# Patient Record
Sex: Female | Born: 1975 | Race: White | Hispanic: No | Marital: Married | Smoking: Never smoker
Health system: Southern US, Community
[De-identification: ages and names within clinical notes are randomized; demographics above are authoritative.]

## PROBLEM LIST (undated history)

## (undated) DIAGNOSIS — K5790 Diverticulosis of intestine, part unspecified, without perforation or abscess without bleeding: Secondary | ICD-10-CM

## (undated) DIAGNOSIS — E7800 Pure hypercholesterolemia, unspecified: Secondary | ICD-10-CM

## (undated) HISTORY — PX: TONSILLECTOMY: SUR1361

## (undated) HISTORY — DX: Pure hypercholesterolemia, unspecified: E78.00

## (undated) HISTORY — DX: Diverticulosis of intestine, part unspecified, without perforation or abscess without bleeding: K57.90

---

## 2006-10-29 ENCOUNTER — Ambulatory Visit (HOSPITAL_COMMUNITY): Admission: RE | Admit: 2006-10-29 | Discharge: 2006-10-29 | Payer: Self-pay

## 2006-11-18 ENCOUNTER — Ambulatory Visit (HOSPITAL_COMMUNITY): Admission: RE | Admit: 2006-11-18 | Discharge: 2006-11-18 | Payer: Self-pay | Admitting: Obstetrics & Gynecology

## 2006-12-08 ENCOUNTER — Ambulatory Visit (HOSPITAL_COMMUNITY): Admission: RE | Admit: 2006-12-08 | Discharge: 2006-12-08 | Payer: Self-pay | Admitting: Obstetrics & Gynecology

## 2006-12-28 ENCOUNTER — Ambulatory Visit (HOSPITAL_COMMUNITY): Admission: RE | Admit: 2006-12-28 | Discharge: 2006-12-28 | Payer: Self-pay | Admitting: Obstetrics and Gynecology

## 2007-01-19 ENCOUNTER — Ambulatory Visit (HOSPITAL_COMMUNITY): Admission: RE | Admit: 2007-01-19 | Discharge: 2007-01-19 | Payer: Self-pay | Admitting: Obstetrics & Gynecology

## 2007-02-08 ENCOUNTER — Ambulatory Visit (HOSPITAL_COMMUNITY): Admission: RE | Admit: 2007-02-08 | Discharge: 2007-02-08 | Payer: Self-pay | Admitting: Obstetrics & Gynecology

## 2007-03-01 ENCOUNTER — Ambulatory Visit (HOSPITAL_COMMUNITY): Admission: RE | Admit: 2007-03-01 | Discharge: 2007-03-01 | Payer: Self-pay | Admitting: Obstetrics & Gynecology

## 2007-03-23 ENCOUNTER — Ambulatory Visit (HOSPITAL_COMMUNITY): Admission: RE | Admit: 2007-03-23 | Discharge: 2007-03-23 | Payer: Self-pay | Admitting: Obstetrics & Gynecology

## 2007-03-24 IMAGING — US US OB FOLLOW-UP
1 series · 19 of 23 positions shown · non-contrast
Comparison: none

OBSTETRICAL ULTRASOUND:
 This ultrasound was performed in The [HOSPITAL], and the [facility] report will be stored to [REDACTED] PACS.

[Series 1: us ob follow-up · 19 of 23 slices shown]
[im 1/23]
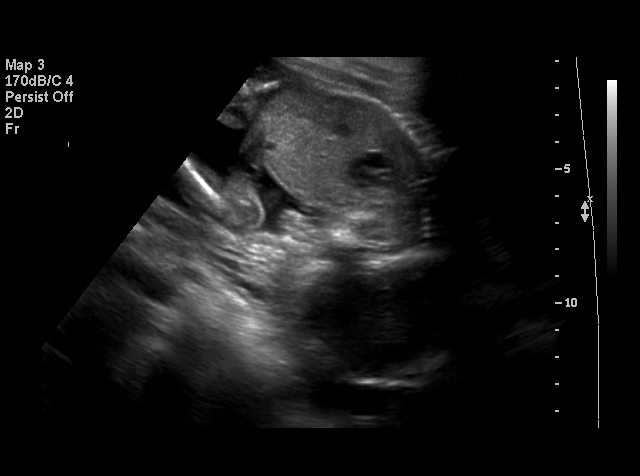
[im 2/23]
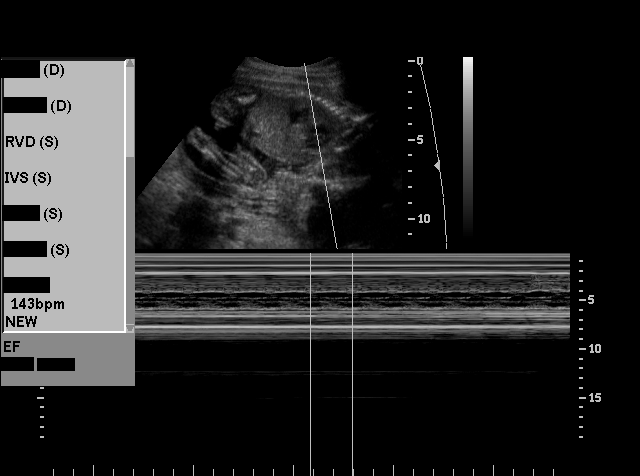
[im 4/23]
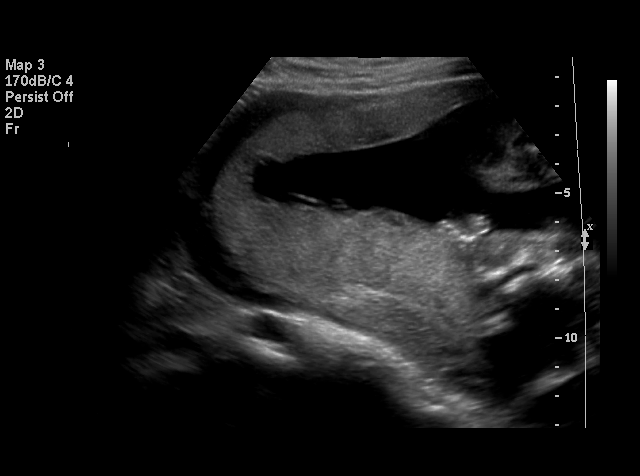
[im 5/23]
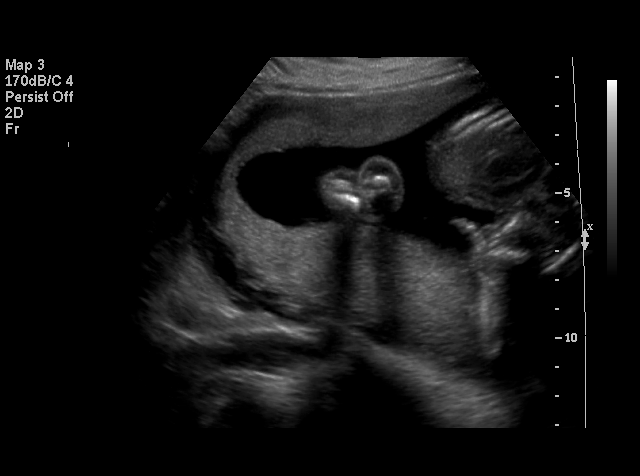
[im 6/23]
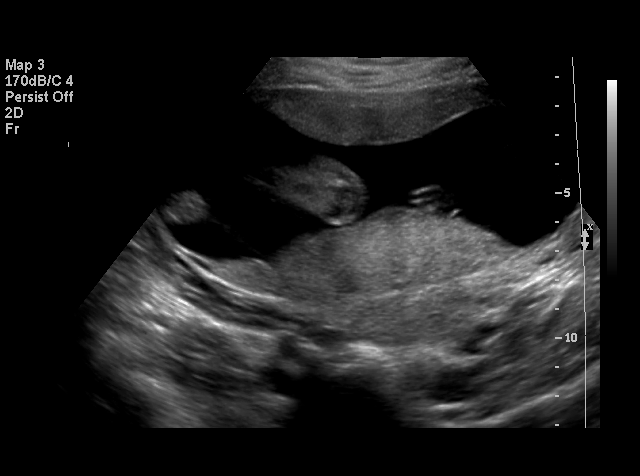
[im 7/23]
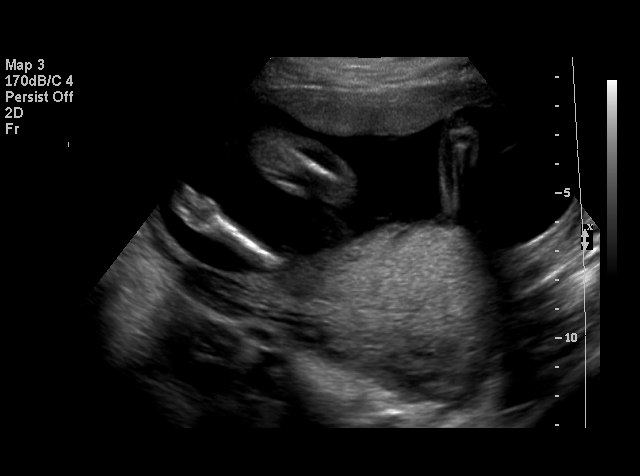
[im 8/23]
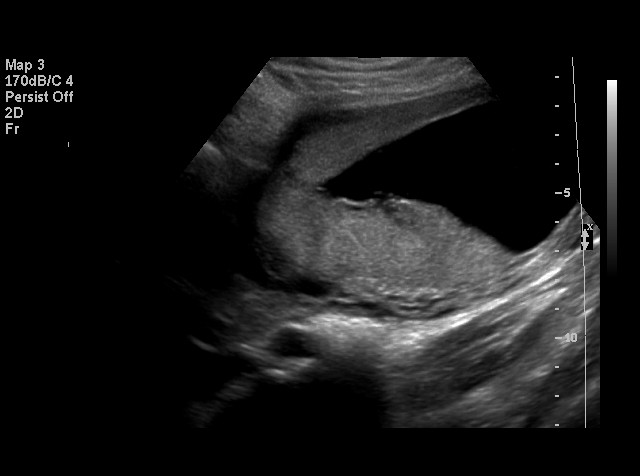
[im 10/23]
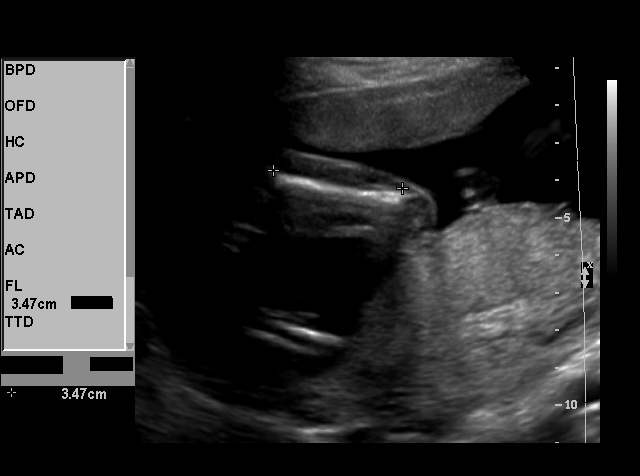
[im 11/23]
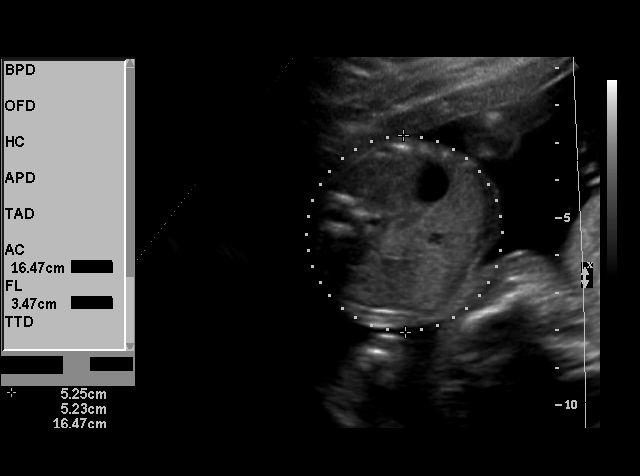
[im 12/23]
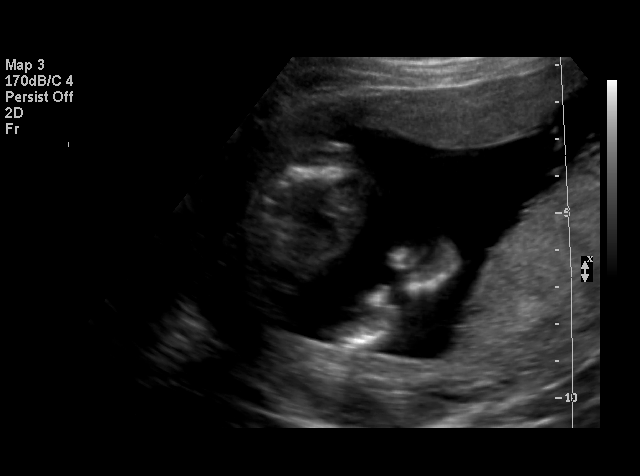
[im 13/23]
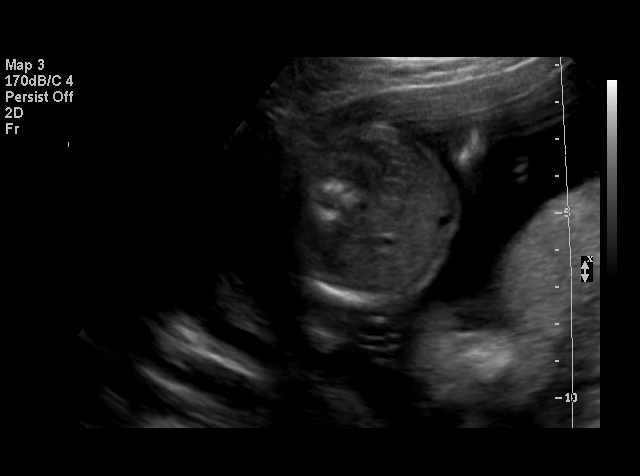
[im 14/23]
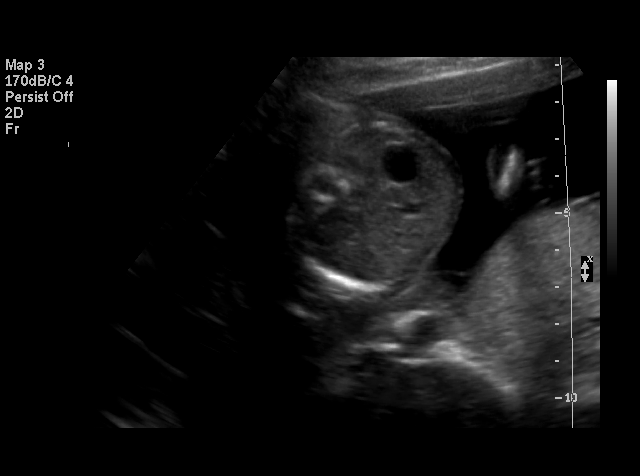
[im 16/23]
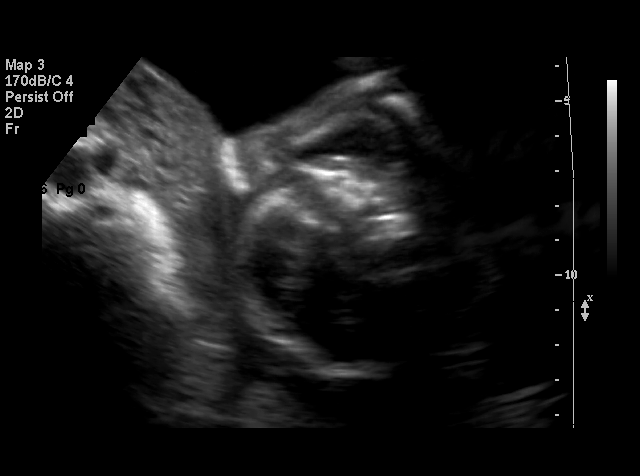
[im 17/23]
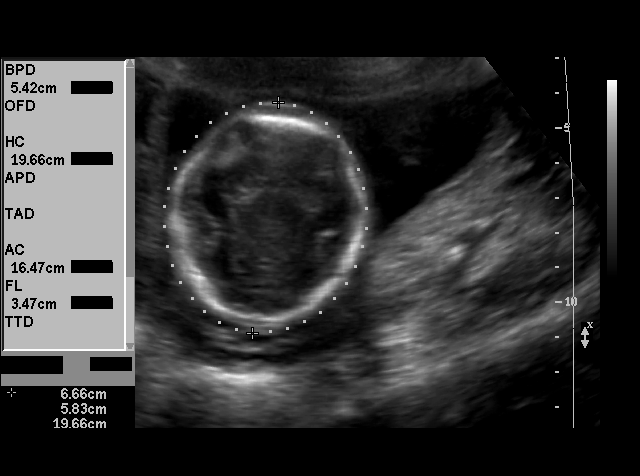
[im 18/23]
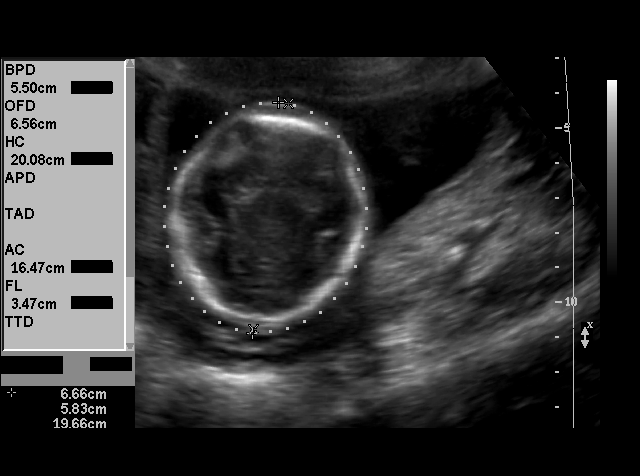
[im 19/23]
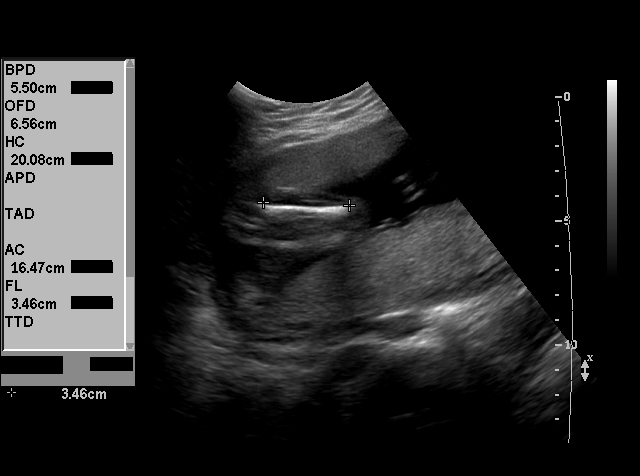
[im 20/23]
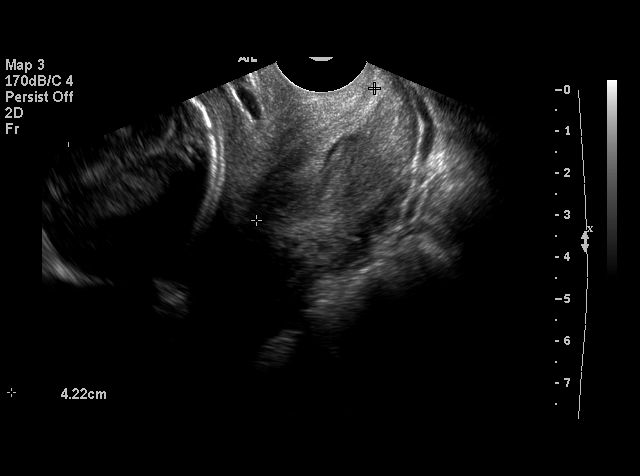
[im 22/23]
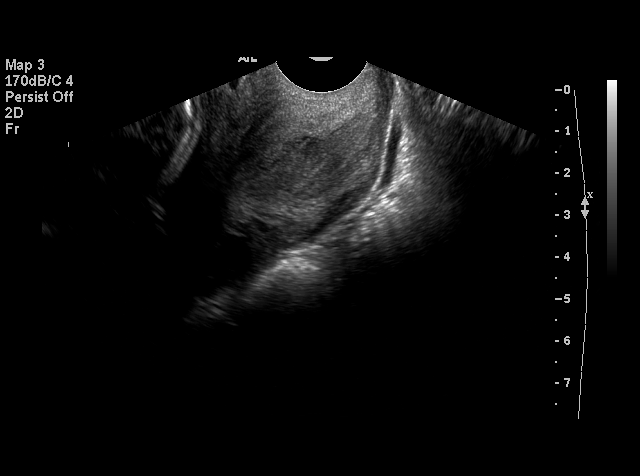
[im 23/23]
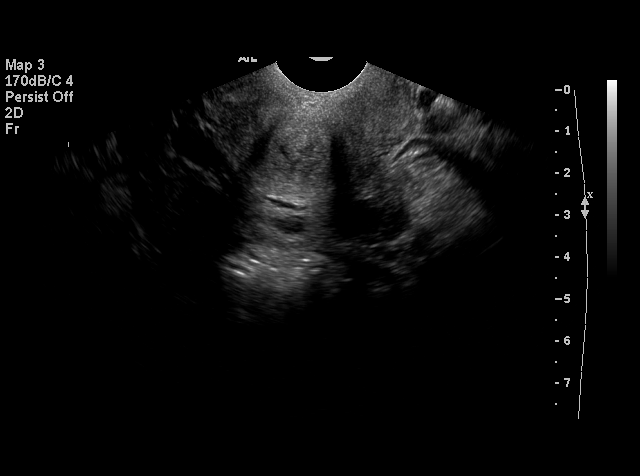

[19 of 23 positions shown; findings below may reference images not displayed]

IMPRESSION: The [facility] report has also been faxed to the ordering physician.

## 2007-04-01 ENCOUNTER — Encounter: Admission: RE | Admit: 2007-04-01 | Discharge: 2007-05-01 | Payer: Self-pay | Admitting: Pulmonary Disease

## 2010-12-01 ENCOUNTER — Encounter: Payer: Self-pay | Admitting: *Deleted

## 2013-10-24 ENCOUNTER — Ambulatory Visit (INDEPENDENT_AMBULATORY_CARE_PROVIDER_SITE_OTHER): Payer: 59 | Admitting: Family Medicine

## 2013-10-24 ENCOUNTER — Encounter: Payer: Self-pay | Admitting: Family Medicine

## 2013-10-24 ENCOUNTER — Encounter (INDEPENDENT_AMBULATORY_CARE_PROVIDER_SITE_OTHER): Payer: Self-pay

## 2013-10-24 VITALS — BP 117/79 | HR 64 | Resp 16 | Ht 66.0 in | Wt 133.0 lb

## 2013-10-24 DIAGNOSIS — R5383 Other fatigue: Secondary | ICD-10-CM

## 2013-10-24 DIAGNOSIS — R5381 Other malaise: Secondary | ICD-10-CM

## 2013-10-24 DIAGNOSIS — Z13220 Encounter for screening for lipoid disorders: Secondary | ICD-10-CM

## 2013-10-24 DIAGNOSIS — Z3041 Encounter for surveillance of contraceptive pills: Secondary | ICD-10-CM

## 2013-10-24 DIAGNOSIS — R05 Cough: Secondary | ICD-10-CM

## 2013-10-24 DIAGNOSIS — R059 Cough, unspecified: Secondary | ICD-10-CM

## 2013-10-24 DIAGNOSIS — Z23 Encounter for immunization: Secondary | ICD-10-CM | POA: Insufficient documentation

## 2013-10-24 LAB — CBC WITH DIFFERENTIAL/PLATELET
Basophils Absolute: 0.1 10*3/uL (ref 0.0–0.1)
Basophils Relative: 1 % (ref 0–1)
Eosinophils Absolute: 0.1 10*3/uL (ref 0.0–0.7)
Eosinophils Relative: 1 % (ref 0–5)
HCT: 42.2 % (ref 36.0–46.0)
Hemoglobin: 14.3 g/dL (ref 12.0–15.0)
Lymphocytes Relative: 28 % (ref 12–46)
Lymphs Abs: 3 10*3/uL (ref 0.7–4.0)
MCH: 31.4 pg (ref 26.0–34.0)
MCHC: 33.9 g/dL (ref 30.0–36.0)
MCV: 92.5 fL (ref 78.0–100.0)
Monocytes Absolute: 0.5 10*3/uL (ref 0.1–1.0)
Monocytes Relative: 5 % (ref 3–12)
Neutro Abs: 7.3 10*3/uL (ref 1.7–7.7)
Neutrophils Relative %: 65 % (ref 43–77)
Platelets: 241 10*3/uL (ref 150–400)
RBC: 4.56 MIL/uL (ref 3.87–5.11)
RDW: 14.3 % (ref 11.5–15.5)
WBC: 11.1 10*3/uL — ABNORMAL HIGH (ref 4.0–10.5)

## 2013-10-24 MED ORDER — HYDROCODONE-HOMATROPINE 5-1.5 MG/5ML PO SYRP: ORAL_SOLUTION | ORAL | Status: DC

## 2013-10-24 MED ORDER — NORGESTREL-ETHINYL ESTRADIOL 0.3-30 MG-MCG PO TABS: 1.0000 | ORAL_TABLET | Freq: Every day | ORAL | Status: DC

## 2013-10-24 MED ORDER — BENZONATATE 200 MG PO CAPS: 200.0000 mg | ORAL_CAPSULE | Freq: Three times a day (TID) | ORAL | Status: DC | PRN

## 2013-10-24 NOTE — Assessment & Plan Note (Signed)
The patient confirmed that they are not allergic to eggs and have never had a bad reaction with the flu shot in the past.  The vaccination was given without difficulty.   

## 2013-10-24 NOTE — Progress Notes (Signed)
   Subjective:    Patient ID: Jordan Bell, female    DOB: 01/26/1976, 37 y.o.   MRN: [id number]  HPI  Jordan Bell is here today to establish care with our practice.  She gets her GYN care from Dr. Bufford Buttner with [facility].  She feels that she needs a PCP closer to her home.  She would like to get a flu shot today.      Review of Systems  Constitutional: Negative for unexpected weight change.  Genitourinary: Negative for menstrual problem.  All other systems reviewed and are negative.     Past Surgical History  Procedure Laterality Date  . 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    Drug Use:  None   Diet:  Regular (No pork or beef)    Exercise:  Gym (Weight, Cardio)  twice weekly    Hobbies: Gardening                 Family History  Problem Relation Age of Onset  . Parkinson's disease Mother   . Thyroid disease Mother     Hypothyroidism  . Scoliosis Maternal Grandmother   . Heart disease Maternal Grandfather      No Known Allergies   Immunization History  Administered Date(s) Administered  . Influenza,inj,Quad PF,36+ Mos 10/24/2013  . Tdap 11/11/2007      Objective:   Physical Exam  Vitals reviewed. Constitutional: She is oriented to person, place, and time.  Eyes: Conjunctivae are normal. No scleral icterus.  Neck: Neck supple. No thyromegaly present.  Cardiovascular: Normal rate, regular rhythm and normal heart sounds.   Pulmonary/Chest: Effort normal and breath sounds normal.  Musculoskeletal: She exhibits no edema and no tenderness.  Lymphadenopathy:    She has no cervical  adenopathy.  Neurological: She is alert and oriented to person, place, and time.  Skin: Skin is warm and dry.  Psychiatric: She has a normal mood and affect. Her behavior is normal. Judgment and thought content normal.      Assessment & Plan:    Bretta was seen today for establish care.  Diagnoses and associated orders for this visit:  Cough - benzonatate (TESSALON) 200 MG capsule; Take 1 capsule (200 mg total) by mouth 3 (three) times daily as needed for cough. - HYDROcodone-homatropine (HYCODAN) 5-1.5 MG/5ML syrup; Take 1-2 teaspoon q 4-6 hours at night  Other malaise and fatigue - CBC with Differential - COMPLETE METABOLIC PANEL WITH GFR - TSH  Need for prophylactic vaccination and inoculation against influenza - Flu Vaccine QUAD 36+ mos PF IM (Fluarix)  Lipid screening - Cholesterol, Total  Uses oral contraception - norgestrel-ethinyl estradiol (CRYSELLE-28) 0.3-30 MG-MCG tablet; Take 1 tablet by mouth daily.   TIME SPENT "FACE TO FACE" WITH PATIENT -  30 MINS

## 2013-10-24 NOTE — Patient Instructions (Addendum)
1)  URI - Take the Cold Eeze (Quick Melt (2) or Lozenge (1) up to 6 x per day ) plus the Umcka Cold Care Drops (2 droppers 3- 4 x per day) ; You can add Tessalon Perles to Delsym for cough and the Hycodan if needed.    Upper Respiratory Infection, Adult An upper respiratory infection (URI) is also sometimes known as the common cold. The upper respiratory tract includes the nose, sinuses, throat, trachea, and bronchi. Bronchi are the airways leading to the lungs. Most people improve within 1 week, but symptoms can last up to 2 weeks. A residual cough may last even longer.  CAUSES Many different viruses can infect the tissues lining the upper respiratory tract. The tissues become irritated and inflamed and often become very moist. Mucus production is also common. A cold is contagious. You can easily spread the virus to others by oral contact. This includes kissing, sharing a glass, coughing, or sneezing. Touching your mouth or nose and then touching a surface, which is then touched by another person, can also spread the virus. SYMPTOMS  Symptoms typically develop 1 to 3 days after you come in contact with a cold virus. Symptoms vary from person to person. They may include:  Runny nose.  Sneezing.  Nasal congestion.  Sinus irritation.  Sore throat.  Loss of voice (laryngitis).  Cough.  Fatigue.  Muscle aches.  Loss of appetite.  Headache.  Low-grade fever. DIAGNOSIS  You might diagnose your own cold based on familiar symptoms, since most people get a cold 2 to 3 times a year. Your caregiver can confirm this based on your exam. Most importantly, your caregiver can check that your symptoms are not due to another disease such as strep throat, sinusitis, pneumonia, asthma, or epiglottitis. Blood tests, throat tests, and X-rays are not necessary to diagnose a common cold, but they may sometimes be helpful in excluding other more serious diseases. Your caregiver will decide if any further  tests are required. RISKS AND COMPLICATIONS  You may be at risk for a more severe case of the common cold if you smoke cigarettes, have chronic heart disease (such as heart failure) or lung disease (such as asthma), or if you have a weakened immune system. The very young and very old are also at risk for more serious infections. Bacterial sinusitis, middle ear infections, and bacterial pneumonia can complicate the common cold. The common cold can worsen asthma and chronic obstructive pulmonary disease (COPD). Sometimes, these complications can require emergency medical care and may be life-threatening. PREVENTION  The best way to protect against getting a cold is to practice good hygiene. Avoid oral or hand contact with people with cold symptoms. Wash your hands often if contact occurs. There is no clear evidence that vitamin C, vitamin E, echinacea, or exercise reduces the chance of developing a cold. However, it is always recommended to get plenty of rest and practice good nutrition. TREATMENT  Treatment is directed at relieving symptoms. There is no cure. Antibiotics are not effective, because the infection is caused by a virus, not by bacteria. Treatment may include:  Increased fluid intake. Sports drinks offer valuable electrolytes, sugars, and fluids.  Breathing heated mist or steam (vaporizer or shower).  Eating chicken soup or other clear broths, and maintaining good nutrition.  Getting plenty of rest.  Using gargles or lozenges for comfort.  Controlling fevers with ibuprofen or acetaminophen as directed by your caregiver.  Increasing usage of your inhaler if you have  asthma. Zinc gel and zinc lozenges, taken in the first 24 hours of the common cold, can shorten the duration and lessen the severity of symptoms. Pain medicines may help with fever, muscle aches, and throat pain. A variety of non-prescription medicines are available to treat congestion and runny nose. Your caregiver can  make recommendations and may suggest nasal or lung inhalers for other symptoms.  HOME CARE INSTRUCTIONS   Only take over-the-counter or prescription medicines for pain, discomfort, or fever as directed by your caregiver.  Use a warm mist humidifier or inhale steam from a shower to increase air moisture. This may keep secretions moist and make it easier to breathe.  Drink enough water and fluids to keep your urine clear or pale yellow.  Rest as needed.  Return to work when your temperature has returned to normal or as your caregiver advises. You may need to stay home longer to avoid infecting others. You can also use a face mask and careful hand washing to prevent spread of the virus. SEEK MEDICAL CARE IF:   After the first few days, you feel you are getting worse rather than better.  You need your caregiver's advice about medicines to control symptoms.  You develop chills, worsening shortness of breath, or brown or red sputum. These may be signs of pneumonia.  You develop yellow or brown nasal discharge or pain in the face, especially when you bend forward. These may be signs of sinusitis.  You develop a fever, swollen neck glands, pain with swallowing, or white areas in the back of your throat. These may be signs of strep throat. SEEK IMMEDIATE MEDICAL CARE IF:   You have a fever.  You develop severe or persistent headache, ear pain, sinus pain, or chest pain.  You develop wheezing, a prolonged cough, cough up blood, or have a change in your usual mucus (if you have chronic lung disease).  You develop sore muscles or a stiff neck. Document Released: 04/22/2001 Document Revised: 01/19/2012 Document Reviewed: 02/28/2011 [facility] Patient Information 2014 [place], [place].

## 2013-10-25 LAB — COMPLETE METABOLIC PANEL WITH GFR
ALT: 19 U/L (ref 0–35)
AST: 22 U/L (ref 0–37)
Albumin: 4.5 g/dL (ref 3.5–5.2)
Alkaline Phosphatase: 29 U/L — ABNORMAL LOW (ref 39–117)
BUN: 18 mg/dL (ref 6–23)
CO2: 27 mEq/L (ref 19–32)
Calcium: 9.3 mg/dL (ref 8.4–10.5)
Chloride: 99 mEq/L (ref 96–112)
Creat: 0.85 mg/dL (ref 0.50–1.10)
GFR, Est African American: >89 mL/min
GFR, Est Non African American: 87 mL/min
Glucose, Bld: 81 mg/dL (ref 70–99)
Potassium: 4.2 mEq/L (ref 3.5–5.3)
Sodium: 136 mEq/L (ref 135–145)
Total Bilirubin: 0.7 mg/dL (ref 0.3–1.2)
Total Protein: 7 g/dL (ref 6.0–8.3)

## 2013-10-25 LAB — TSH: TSH: 1.331 u[IU]/mL (ref 0.350–4.500)

## 2013-10-25 LAB — CHOLESTEROL, TOTAL: Cholesterol: 204 mg/dL — ABNORMAL HIGH (ref 0–200)

## 2013-12-18 DIAGNOSIS — R059 Cough, unspecified: Secondary | ICD-10-CM | POA: Insufficient documentation

## 2013-12-18 DIAGNOSIS — Z3041 Encounter for surveillance of contraceptive pills: Secondary | ICD-10-CM | POA: Insufficient documentation

## 2013-12-18 DIAGNOSIS — R05 Cough: Secondary | ICD-10-CM | POA: Insufficient documentation

## 2013-12-18 DIAGNOSIS — R5383 Other fatigue: Secondary | ICD-10-CM

## 2013-12-18 DIAGNOSIS — Z13220 Encounter for screening for lipoid disorders: Secondary | ICD-10-CM | POA: Insufficient documentation

## 2013-12-18 DIAGNOSIS — R5381 Other malaise: Secondary | ICD-10-CM | POA: Insufficient documentation

## 2022-03-12 ENCOUNTER — Encounter: Payer: Self-pay | Admitting: Gastroenterology

## 2022-04-10 ENCOUNTER — Ambulatory Visit (AMBULATORY_SURGERY_CENTER): Payer: Self-pay | Admitting: *Deleted

## 2022-04-10 VITALS — Ht 66.0 in | Wt 138.0 lb

## 2022-04-10 DIAGNOSIS — Z1211 Encounter for screening for malignant neoplasm of colon: Secondary | ICD-10-CM

## 2022-04-10 HISTORY — PX: COLONOSCOPY: SHX174

## 2022-04-10 MED ORDER — NA SULFATE-K SULFATE-MG SULF 17.5-3.13-1.6 GM/177ML PO SOLN: 2.0000 | Freq: Once | ORAL | 0 refills | Status: AC

## 2022-04-10 NOTE — Progress Notes (Signed)
No egg or soy allergy known to patient  No issues known to pt with past sedation with any surgeries or procedures Patient denies ever being told they had issues or difficulty with intubation  No FH of Malignant Hyperthermia Pt is not on diet pills Pt is not on  home 02  Pt is not on blood thinners  Pt denies issues with constipation  No A fib or A flutter  Pt instructed to use Singlecare.com or GoodRx for a price reduction on prep   PV completed over the phone. Pt verified name, DOB, address and insurance during PV today.   Pt encouraged to call with questions or issues.  If pt has My chart, procedure instructions sent via My Chart   

## 2022-04-21 ENCOUNTER — Encounter: Payer: Self-pay | Admitting: Gastroenterology

## 2022-04-29 ENCOUNTER — Encounter: Payer: Self-pay | Admitting: Gastroenterology

## 2022-05-02 ENCOUNTER — Ambulatory Visit (AMBULATORY_SURGERY_CENTER): Payer: 59 | Admitting: Gastroenterology

## 2022-05-02 ENCOUNTER — Encounter: Payer: Self-pay | Admitting: Gastroenterology

## 2022-05-02 VITALS — BP 123/75 | HR 60 | Temp 98.2°F | Resp 24 | Ht 66.0 in | Wt 138.0 lb

## 2022-05-02 DIAGNOSIS — K5190 Ulcerative colitis, unspecified, without complications: Secondary | ICD-10-CM | POA: Diagnosis not present

## 2022-05-02 DIAGNOSIS — K633 Ulcer of intestine: Secondary | ICD-10-CM | POA: Diagnosis not present

## 2022-05-02 DIAGNOSIS — Z1211 Encounter for screening for malignant neoplasm of colon: Secondary | ICD-10-CM | POA: Diagnosis present

## 2022-05-02 DIAGNOSIS — K635 Polyp of colon: Secondary | ICD-10-CM

## 2022-05-02 DIAGNOSIS — D125 Benign neoplasm of sigmoid colon: Secondary | ICD-10-CM

## 2022-05-02 MED ORDER — SODIUM CHLORIDE 0.9 % IV SOLN: 500.0000 mL | Freq: Once | INTRAVENOUS | Status: DC

## 2022-05-02 NOTE — Progress Notes (Signed)
   Referring Provider: Tressia Danas, MD Primary Care Physician:  Gillian Scarce, MD  Indication for Procedure:  Colon cancer screening   IMPRESSION:  Need for colon cancer screening Appropriate candidate for monitored anesthesia care  PLAN: Colonoscopy in the [facility] today   HPI: Jordan Bell is a 46 y.o. female presents for screening colonoscopy.  No prior colonoscopy or colon cancer screening.  No baseline GI symptoms.   No known family history of colon cancer or polyps. No family history of uterine/endometrial cancer, pancreatic cancer or gastric/stomach cancer.   History reviewed. No pertinent past medical history.  Past Surgical History:  Procedure Laterality Date   TONSILLECTOMY      Current Outpatient Medications  Medication Sig Dispense Refill   SRONYX 0.1-20 MG-MCG tablet Take 1 tablet by mouth daily.     norgestrel-ethinyl estradiol (CRYSELLE-28) 0.3-30 MG-MCG tablet Take 1 tablet by mouth daily. (Patient not taking: Reported on 05/02/2022) 3 Package 3   Current Facility-Administered Medications  Medication Dose Route Frequency Provider Last Rate Last Admin   0.9 %  sodium chloride infusion  500 mL Intravenous Once Tressia Danas, MD        Allergies as of 05/02/2022   (No Known Allergies)    Family History  Problem Relation Age of Onset   Parkinson's disease Mother    Thyroid disease Mother        Hypothyroidism   Scoliosis Maternal Grandmother    Heart disease Maternal Grandfather    Colon cancer Neg Hx    Colon polyps Neg Hx    Esophageal cancer Neg Hx    Stomach cancer Neg Hx    Rectal cancer Neg Hx      Physical Exam: General:   Alert,  well-nourished, pleasant and cooperative in NAD Head:  Normocephalic and atraumatic. Eyes:  Sclera clear, no icterus.   Conjunctiva pink. Mouth:  No deformity or lesions.   Neck:  Supple; no masses or thyromegaly. Lungs:  Clear throughout to auscultation.   No wheezes. Heart:  Regular rate and  rhythm; no murmurs. Abdomen:  Soft, non-tender, nondistended, normal bowel sounds, no rebound or guarding.  Msk:  Symmetrical. No boney deformities LAD: No inguinal or umbilical LAD Extremities:  No clubbing or edema. Neurologic:  Alert and  oriented x4;  grossly nonfocal Skin:  No obvious rash or bruise. Psych:  Alert and cooperative. Normal mood and affect.     Studies/Results: No results found.    Kahlia Lagunes L. Orvan Falconer, MD, MPH 05/02/2022, 10:54 AM

## 2022-05-05 ENCOUNTER — Telehealth: Payer: Self-pay

## 2022-05-14 ENCOUNTER — Encounter: Payer: Self-pay | Admitting: Gastroenterology

## 2022-12-30 ENCOUNTER — Ambulatory Visit: Payer: 59 | Admitting: Physician Assistant

## 2023-01-08 ENCOUNTER — Encounter: Payer: Self-pay | Admitting: Physician Assistant

## 2023-01-08 ENCOUNTER — Ambulatory Visit (INDEPENDENT_AMBULATORY_CARE_PROVIDER_SITE_OTHER): Payer: 59 | Admitting: Physician Assistant

## 2023-01-08 VITALS — BP 125/80 | HR 59 | Temp 97.5°F | Ht 66.0 in | Wt 143.4 lb

## 2023-01-08 DIAGNOSIS — E782 Mixed hyperlipidemia: Secondary | ICD-10-CM

## 2023-01-08 DIAGNOSIS — K5790 Diverticulosis of intestine, part unspecified, without perforation or abscess without bleeding: Secondary | ICD-10-CM

## 2023-01-08 DIAGNOSIS — Z1329 Encounter for screening for other suspected endocrine disorder: Secondary | ICD-10-CM

## 2023-01-08 DIAGNOSIS — Z131 Encounter for screening for diabetes mellitus: Secondary | ICD-10-CM

## 2023-01-08 LAB — COMPREHENSIVE METABOLIC PANEL
ALT: 22 U/L (ref 0–35)
AST: 21 U/L (ref 0–37)
Albumin: 4.7 g/dL (ref 3.5–5.2)
Alkaline Phosphatase: 35 U/L — ABNORMAL LOW (ref 39–117)
BUN: 17 mg/dL (ref 6–23)
CO2: 25 mEq/L (ref 19–32)
Calcium: 9.9 mg/dL (ref 8.4–10.5)
Chloride: 102 mEq/L (ref 96–112)
Creatinine, Ser: 0.91 mg/dL (ref 0.40–1.20)
GFR: 74.46 mL/min (ref >60.00)
Glucose, Bld: 80 mg/dL (ref 70–99)
Potassium: 5 mEq/L (ref 3.5–5.1)
Sodium: 139 mEq/L (ref 135–145)
Total Bilirubin: 0.5 mg/dL (ref 0.2–1.2)
Total Protein: 7.2 g/dL (ref 6.0–8.3)

## 2023-01-08 LAB — CBC WITH DIFFERENTIAL/PLATELET
Basophils Absolute: 0 10*3/uL (ref 0.0–0.1)
Basophils Relative: 0.6 % (ref 0.0–3.0)
Eosinophils Absolute: 0 10*3/uL (ref 0.0–0.7)
Eosinophils Relative: 0.3 % (ref 0.0–5.0)
HCT: 43.6 % (ref 36.0–46.0)
Hemoglobin: 14.5 g/dL (ref 12.0–15.0)
Lymphocytes Relative: 33.5 % (ref 12.0–46.0)
Lymphs Abs: 2.6 10*3/uL (ref 0.7–4.0)
MCHC: 33.4 g/dL (ref 30.0–36.0)
MCV: 94.9 fl (ref 78.0–100.0)
Monocytes Absolute: 0.4 10*3/uL (ref 0.1–1.0)
Monocytes Relative: 5.1 % (ref 3.0–12.0)
Neutro Abs: 4.8 10*3/uL (ref 1.4–7.7)
Neutrophils Relative %: 60.5 % (ref 43.0–77.0)
Platelets: 161 10*3/uL (ref 150.0–400.0)
RBC: 4.6 Mil/uL (ref 3.87–5.11)
RDW: 13.3 % (ref 11.5–15.5)
WBC: 7.9 10*3/uL (ref 4.0–10.5)

## 2023-01-08 LAB — LIPID PANEL
Cholesterol: 228 mg/dL — ABNORMAL HIGH (ref 0–200)
HDL: 61.5 mg/dL (ref >39.00)
LDL Cholesterol: 142 mg/dL — ABNORMAL HIGH (ref 0–99)
NonHDL: 166.6
Total CHOL/HDL Ratio: 4
Triglycerides: 123 mg/dL (ref 0.0–149.0)
VLDL: 24.6 mg/dL (ref 0.0–40.0)

## 2023-01-08 LAB — TSH: TSH: 0.93 u[IU]/mL (ref 0.35–5.50)

## 2023-01-08 NOTE — Progress Notes (Signed)
Subjective:    Patient ID: Jordan Bell, female    DOB: [birth year], 47 y.o.   MRN: [id number]  Chief Complaint  Patient presents with   New Patient (Initial Visit)    HPI 47 y.o. patient presents today for new patient establishment with me.  Patient was previously established with Dr. Diamantina Providence. Children ages 68,13,11, all girls.  Current Care Team: GYN - UTD with pap and mammo, all normal   Dermatology annually   Concerns: High cholesterol; mom has hx of this as well. Eats well daily, enjoys exercise when she can get it.   The 10-year ASCVD risk score (Arnett DK, et al., 2019) is: 1%   Values used to calculate the score:     Age: 62 years     Sex: Female     Is Non-Hispanic African American: No     Diabetic: No     Tobacco smoker: No     Systolic Blood Pressure: 0000000 mmHg     Is BP treated: No     HDL Cholesterol: 61.5 mg/dL     Total Cholesterol: 228 mg/dL    Past Medical History:  Diagnosis Date   Diverticulosis    High cholesterol     Past Surgical History:  Procedure Laterality Date   TONSILLECTOMY      Family History  Problem Relation Age of Onset   Parkinson's disease Mother    Thyroid disease Mother        Hypothyroidism   Cancer - Other Father        blood cancer   Scoliosis Maternal Grandmother    Heart disease Maternal Grandfather    Colon cancer Neg Hx    Colon polyps Neg Hx    Esophageal cancer Neg Hx    Stomach cancer Neg Hx    Rectal cancer Neg Hx     Social History   Tobacco Use   Smoking status: Never   Smokeless tobacco: Never  Vaping Use   Vaping Use: Never used  Substance Use Topics   Alcohol use: Yes    Alcohol/week: 7.0 standard drinks of alcohol    Types: 7 Glasses of wine per week    Comment: One Glass of wine daily   Drug use: No     No Known Allergies  Review of Systems NEGATIVE UNLESS OTHERWISE INDICATED IN HPI      Objective:     BP 125/80 (BP Location: Left Arm, Patient Position: Sitting)   Pulse  (!) 59   Temp (!) 97.5 F (36.4 C) (Temporal)   Ht '5\' 6"'$  (1.676 m)   Wt 143 lb 6.4 oz (65 kg)   SpO2 100%   BMI 23.15 kg/m   Wt Readings from Last 3 Encounters:  01/08/23 143 lb 6.4 oz (65 kg)  05/02/22 138 lb (62.6 kg)  04/10/22 138 lb (62.6 kg)    BP Readings from Last 3 Encounters:  01/08/23 125/80  05/02/22 123/75  10/24/13 117/79     Physical Exam Vitals and nursing note reviewed.  Constitutional:      Appearance: Normal appearance.  Eyes:     Extraocular Movements: Extraocular movements intact.     Conjunctiva/sclera: Conjunctivae normal.     Pupils: Pupils are equal, round, and reactive to light.  Cardiovascular:     Rate and Rhythm: Normal rate and regular rhythm.  Pulmonary:     Effort: Pulmonary effort is normal.     Breath sounds: Normal breath sounds.  Skin:  Findings: No lesion or rash.  Neurological:     General: No focal deficit present.     Mental Status: She is alert and oriented to person, place, and time.  Psychiatric:        Mood and Affect: Mood normal.        Behavior: Behavior normal.        Assessment & Plan:  Mixed hyperlipidemia Assessment & Plan: The 10-year ASCVD risk score (Arnett DK, et al., 2019) is: 1%   Values used to calculate the score:     Age: 53 years     Sex: Female     Is Non-Hispanic African American: No     Diabetic: No     Tobacco smoker: No     Systolic Blood Pressure: 0000000 mmHg     Is BP treated: No     HDL Cholesterol: 61.5 mg/dL     Total Cholesterol: 228 mg/dL  Discussed with patient risk score less than 7.5%, low-risk overall, wouldn't consider statin therapy at this time. Continued lifestyle modifications. Consider lipid clinic in future and/or coronary calcium scan. Monitor annually.   Orders: -     Lipid panel  Diverticulosis Assessment & Plan: Noted on colonoscopy 05/02/22. Never has had any diverticulitis flare-ups. Discussed signs / symptoms to watch for.   Orders: -     CBC with  Differential/Platelet -     Comprehensive metabolic panel  Diabetes mellitus screening -     Comprehensive metabolic panel  Thyroid disorder screen -     TSH    Pleasant new patient establishing care. Overall in good health. Doing well with lifestyle. Encouraged her to keep up good work. Labs today. Call if any concerns.     Return in about 1 year (around 01/08/2024) for CPE, fasting labs .  This note was prepared with assistance of Systems analyst. Occasional wrong-word or sound-a-like substitutions may have occurred due to the inherent limitations of voice recognition software.    Nesta Scaturro M Goran Olden, PA-C

## 2023-01-14 NOTE — Assessment & Plan Note (Signed)
Noted on colonoscopy 05/02/22. Never has had any diverticulitis flare-ups. Discussed signs / symptoms to watch for.

## 2023-01-14 NOTE — Assessment & Plan Note (Signed)
The 10-year ASCVD risk score (Arnett DK, et al., 2019) is: 1%   Values used to calculate the score:     Age: 47 years     Sex: Female     Is Non-Hispanic African American: No     Diabetic: No     Tobacco smoker: No     Systolic Blood Pressure: 0000000 mmHg     Is BP treated: No     HDL Cholesterol: 61.5 mg/dL     Total Cholesterol: 228 mg/dL  Discussed with patient risk score less than 7.5%, low-risk overall, wouldn't consider statin therapy at this time. Continued lifestyle modifications. Consider lipid clinic in future and/or coronary calcium scan. Monitor annually.

## 2023-01-29 LAB — HM MAMMOGRAPHY

## 2023-02-04 ENCOUNTER — Encounter: Payer: Self-pay | Admitting: Physician Assistant

## 2023-04-08 ENCOUNTER — Encounter: Payer: Self-pay | Admitting: Gastroenterology

## 2023-06-03 ENCOUNTER — Encounter: Payer: Self-pay | Admitting: Gastroenterology

## 2023-08-06 ENCOUNTER — Encounter: Payer: Self-pay | Admitting: Gastroenterology

## 2023-08-06 ENCOUNTER — Other Ambulatory Visit: Payer: Self-pay

## 2023-08-06 ENCOUNTER — Ambulatory Visit (AMBULATORY_SURGERY_CENTER): Payer: 59

## 2023-08-06 VITALS — Ht 66.0 in | Wt 135.0 lb

## 2023-08-06 DIAGNOSIS — Z860100 Personal history of colon polyps, unspecified: Secondary | ICD-10-CM

## 2023-08-06 MED ORDER — NA SULFATE-K SULFATE-MG SULF 17.5-3.13-1.6 GM/177ML PO SOLN: 1.0000 | Freq: Once | ORAL | 0 refills | Status: AC

## 2023-08-06 NOTE — Progress Notes (Signed)
Denies allergies to eggs or soy products. Denies complication of anesthesia or sedation. Denies use of weight loss medication. Denies use of O2.   Emmi instructions given for colonoscopy.  

## 2023-08-21 ENCOUNTER — Ambulatory Visit: Payer: 59 | Admitting: Gastroenterology

## 2023-08-21 ENCOUNTER — Encounter: Payer: Self-pay | Admitting: Gastroenterology

## 2023-08-21 VITALS — BP 121/72 | HR 65 | Temp 98.0°F | Resp 16 | Ht 66.0 in | Wt 135.0 lb

## 2023-08-21 DIAGNOSIS — K635 Polyp of colon: Secondary | ICD-10-CM | POA: Diagnosis not present

## 2023-08-21 DIAGNOSIS — K633 Ulcer of intestine: Secondary | ICD-10-CM | POA: Diagnosis present

## 2023-08-21 DIAGNOSIS — D125 Benign neoplasm of sigmoid colon: Secondary | ICD-10-CM | POA: Diagnosis not present

## 2023-08-21 MED ORDER — SODIUM CHLORIDE 0.9 % IV SOLN: 500.0000 mL | Freq: Once | INTRAVENOUS | Status: DC

## 2023-08-21 NOTE — Progress Notes (Signed)
History & Physical  Primary Care Physician:  Allwardt, Crist Infante, PA-C Primary Gastroenterologist: Claudette Head, MD  Impression / Plan:  History of small colonic ulcer for colonoscopy   CHIEF COMPLAINT:  colonic ulcer  HPI: Jordan Bell is a 47 y.o. female with a history of a small colon ulcer for colonoscopy.    Past Medical History:  Diagnosis Date   Diverticulosis    High cholesterol     Past Surgical History:  Procedure Laterality Date   COLONOSCOPY Bilateral 04/2022   hepatic flexure ulcer, hyperplastic polyp, Tressia Danas at [facility]   TONSILLECTOMY      Prior to Admission medications   Medication Sig Start Date End Date Taking? Authorizing Provider  SRONYX 0.1-20 MG-MCG tablet Take 1 tablet by mouth daily. 02/24/22  Yes [provider]    Current Outpatient Medications  Medication Sig Dispense Refill   SRONYX 0.1-20 MG-MCG tablet Take 1 tablet by mouth daily.     Current Facility-Administered Medications  Medication Dose Route Frequency Provider Last Rate Last Admin   0.9 %  sodium chloride infusion  500 mL Intravenous Once Meryl Dare, MD        Allergies as of 08/21/2023   (No Known Allergies)    Family History  Problem Relation Age of Onset   Parkinson's disease Mother    Thyroid disease Mother        Hypothyroidism   Cancer - Other Father        blood cancer   Scoliosis Maternal Grandmother    Heart disease Maternal Grandfather    Colon cancer Neg Hx    Colon polyps Neg Hx    Esophageal cancer Neg Hx    Stomach cancer Neg Hx    Rectal cancer Neg Hx     Social History   Socioeconomic History   Marital status: Married    Spouse name: Minerva Areola    Number of children: 3   Years of education: 18   Highest education level: Not on file  Occupational History   Occupation: SOCIAL WORKER    Employer: [employer]  Tobacco Use   Smoking status: Never   Smokeless tobacco: Never  Vaping Use   Vaping status: Never Used  Substance and  Sexual Activity   Alcohol use: Yes    Alcohol/week: 7.0 standard drinks of alcohol    Types: 7 Glasses of wine per week    Comment: One Glass of wine daily   Drug use: Never   Sexual activity: Yes    Partners: Male    Birth control/protection: None  Other Topics Concern   Not on file  Social History Narrative   Marital Status:  Married Multimedia programmer)    Children:  Daughters (Brooke 6, Sarah 4 and Geographical information systems officer 2)    Pets: Dog (1)    Living Situation: Lives with husband and children    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    Drug Use:  None   Diet:  Regular (No pork or beef)    Exercise:  Gym (Weight, Cardio)  twice weekly    Hobbies: Gardening               Social Determinants of Health   Financial Resource Strain: Low Risk  (01/21/2023)   Received from [facility],  [facility]   Overall Financial Resource Strain (CARDIA)    Difficulty of Paying Living Expenses: Not hard at all  Food Insecurity: No Food Insecurity (01/21/2023)   Received from [facility], [facility]   Hunger Vital Sign    Worried About Running Out of Food in the Last Year: Never true    Ran Out of Food in the Last Year: Never true  Transportation Needs: No Transportation Needs (01/21/2023)   Received from [facility], [facility]   PRAPARE - Transportation    Lack of Transportation (Medical): No    Lack of Transportation (Non-Medical): No  Physical Activity: Insufficiently Active (01/21/2023)   Received from [facility], [facility]   Exercise Vital Sign    Days of Exercise per Week: 3 days    Minutes of Exercise per Session: 20 min  Stress: No Stress Concern Present (01/21/2023)   Received from [facility], [facility] of Occupational Health - Occupational Stress Questionnaire    Feeling of Stress : Not at all  Social  Connections: Socially Integrated (01/21/2023)   Received from [facility], [facility]   Social Network    How would you rate your social network (family, work, friends)?: Good participation with social networks  Intimate Partner Violence: Not At Risk (01/21/2023)   Received from [facility], [facility]   HITS    Over the last 12 months how often did your partner physically hurt you?: 1    Over the last 12 months how often did your partner insult you or talk down to you?: 1    Over the last 12 months how often did your partner threaten you with physical harm?: 1    Over the last 12 months how often did your partner scream or curse at you?: 1    Review of Systems:  All systems reviewed were negative except where noted in HPI.   Physical Exam:  General:  Alert, well-developed, in NAD Head:  Normocephalic and atraumatic. Eyes:  Sclera clear, no icterus.   Conjunctiva pink. Ears:  Normal auditory acuity. Mouth:  No deformity or lesions.  Neck:  Supple; no masses. Lungs:  Clear throughout to auscultation.   No wheezes, crackles, or rhonchi.  Heart:  Regular rate and rhythm; no murmurs. Abdomen:  Soft, nondistended, nontender. No masses, hepatomegaly. No palpable masses.  Normal bowel sounds.    Rectal:  Deferred   Msk:  Symmetrical without gross deformities. Extremities:  Without edema. Neurologic:  Alert and  oriented x 4; grossly normal neurologically. Skin:  Intact without significant lesions or rashes. Psych:  Alert and cooperative. Normal mood and affect.   [name]. [name]  08/21/2023, 9:20 AM See Loretha Stapler, [facility], to contact our on call provider

## 2023-08-21 NOTE — Op Note (Signed)
[facility] Patient Name: Jordan Bell Procedure Date: 08/21/2023 9:14 AM MRN: [id number] Endoscopist: Meryl Dare , MD, [phone] Age: 47 Referring MD:  Date of Birth: 05-May-1976 Gender: Female Account #: [id number] Procedure:                Colonoscopy Indications:              Follow-up of colonic ulcer Medicines:                Monitored Anesthesia Care Procedure:                Pre-Anesthesia Assessment:                           - Prior to the procedure, a History and Physical                            was performed, and patient medications and                            allergies were reviewed. The patient's tolerance of                            previous anesthesia was also reviewed. The risks                            and benefits of the procedure and the sedation                            options and risks were discussed with the patient.                            All questions were answered, and informed consent                            was obtained. Prior Anticoagulants: The patient has                            taken no anticoagulant or antiplatelet agents. ASA                            Grade Assessment: II - A patient with mild systemic                            disease. After reviewing the risks and benefits,                            the patient was deemed in satisfactory condition to                            undergo the procedure.                           After obtaining informed consent, the colonoscope  was passed under direct vision. Throughout the                            procedure, the patient's blood pressure, pulse, and                            oxygen saturations were monitored continuously. The                            CF [id number] #[id number] was introduced through the anus                            and advanced to the the cecum, identified by                            appendiceal orifice and ileocecal  valve. The                            ileocecal valve, appendiceal orifice, and rectum                            were photographed. The quality of the bowel                            preparation was excellent. The colonoscopy was                            performed without difficulty. The patient tolerated                            the procedure well. Scope In: 9:28:40 AM Scope Out: 9:49:00 AM Scope Withdrawal Time: 0 hours 13 minutes 35 seconds  Total Procedure Duration: 0 hours 20 minutes 20 seconds  Findings:                 The perianal and digital rectal examinations were                            normal.                           A 5 mm polyp was found in the sigmoid colon. The                            polyp was sessile. The polyp was removed with a                            cold snare. Resection and retrieval were complete.                           A submucosal non-obstructing medium-sized mass was                            found at the appendiceal orifice. The mass was  non-circumferential. The mass measured two cm in                            length. In addition, its diameter measured ten mm.                            No bleeding was present. This was biopsied with a                            cold forceps for histology.                           A medium scar was found at the hepatic flexure.                            Suspected site of prior ulcer.                           Multiple medium-mouthed and small-mouthed                            diverticula were found in the entire colon. There                            was no evidence of diverticular bleeding.                           Internal hemorrhoids were found during                            retroflexion. The hemorrhoids were small and Grade                            I (internal hemorrhoids that do not prolapse).                           The exam was otherwise without abnormality  on                            direct and retroflexion views. Complications:            No immediate complications. Estimated blood loss:                            None. Estimated Blood Loss:     Estimated blood loss: none. Impression:               - A 5 mm polyp was found in the sigmoid colon.                            Removed by cold snare, retrieved.                           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                           -  Scar at the hepatic flexure.                           - Mild diverticulosis in the entire examined colon.                           - Small internal hemorrhoids.                           - The examination was otherwise normal on direct                            and retroflexion views. Recommendation:           - Repeat colonoscopy after studies are complete for                            surveillance based on pathology results.                           - Patient has a contact number available for                            emergencies. The signs and symptoms of potential                            delayed complications were discussed with the                            patient. Return to normal activities tomorrow.                            Written discharge instructions were provided to the                            patient.                           - Resume previous diet.                           - Continue present medications.                           - Await pathology results.                           - CT scan (computed tomography) of abdomen with                            contrast and pelvis with contrast at the next                            available appointment. Meryl Dare, MD 08/21/2023 10:03:17 AM This report has been signed electronically.

## 2023-08-21 NOTE — Progress Notes (Signed)
Vss nad trans to pacu 

## 2023-08-21 NOTE — Progress Notes (Signed)
Pt's states no medical or surgical changes since previsit or office visit. 

## 2023-08-21 NOTE — Patient Instructions (Addendum)
-  Handout on polyps, hemorrhoids, and diverticulosis provided -await pathology results -repeat colonoscopy for surveillance recommended. Date to be determined when pathology result become available   -Continue present medications  -Office will call you to schedule CT scan  YOU HAD AN ENDOSCOPIC PROCEDURE TODAY AT THE [facility]:   Refer to the procedure report that was given to you for any specific questions about what was found during the examination.  If the procedure report does not answer your questions, please call your gastroenterologist to clarify.  If you requested that your care partner not be given the details of your procedure findings, then the procedure report has been included in a sealed envelope for you to review at your convenience later.  YOU SHOULD EXPECT: Some feelings of bloating in the abdomen. Passage of more gas than usual.  Walking can help get rid of the air that was put into your GI tract during the procedure and reduce the bloating. If you had a lower endoscopy (such as a colonoscopy or flexible sigmoidoscopy) you may notice spotting of blood in your stool or on the toilet paper. If you underwent a bowel prep for your procedure, you may not have a normal bowel movement for a few days.  Please Note:  You might notice some irritation and congestion in your nose or some drainage.  This is from the oxygen used during your procedure.  There is no need for concern and it should clear up in a day or so.  SYMPTOMS TO REPORT IMMEDIATELY:  Following lower endoscopy (colonoscopy or flexible sigmoidoscopy):  Excessive amounts of blood in the stool  Significant tenderness or worsening of abdominal pains  Swelling of the abdomen that is new, acute  Fever of 100F or higher   For urgent or emergent issues, a gastroenterologist can be reached at any hour by calling [phone]. Do not use MyChart messaging for urgent concerns.    DIET:  We do recommend a small  meal at first, but then you may proceed to your regular diet.  Drink plenty of fluids but you should avoid alcoholic beverages for 24 hours.  ACTIVITY:  You should plan to take it easy for the rest of today and you should NOT DRIVE or use heavy machinery until tomorrow (because of the sedation medicines used during the test).    FOLLOW UP: Our staff will call the number listed on your records the next business day following your procedure.  We will call around 7:15- 8:00 am to check on you and address any questions or concerns that you may have regarding the information given to you following your procedure. If we do not reach you, we will leave a message.     If any biopsies were taken you will be contacted by phone or by letter within the next 1-3 weeks.  Please call us at [phone] if you have not heard about the biopsies in 3 weeks.    SIGNATURES/CONFIDENTIALITY: You and/or your care partner have signed paperwork which will be entered into your electronic medical record.  These signatures attest to the fact that that the information above on your After Visit Summary has been reviewed and is understood.  Full responsibility of the confidentiality of this discharge information lies with you and/or your care-partner.

## 2023-08-21 NOTE — Progress Notes (Signed)
Called to room to assist during endoscopic procedure.  Patient ID and intended procedure confirmed with present staff. Received instructions for my participation in the procedure from the performing physician.  

## 2023-08-24 ENCOUNTER — Encounter: Payer: Self-pay | Admitting: Gastroenterology

## 2023-08-24 ENCOUNTER — Other Ambulatory Visit: Payer: Self-pay

## 2023-08-24 ENCOUNTER — Telehealth: Payer: Self-pay | Admitting: *Deleted

## 2023-08-24 DIAGNOSIS — K6389 Other specified diseases of intestine: Secondary | ICD-10-CM

## 2023-08-24 NOTE — Telephone Encounter (Signed)
1  Follow up Call-     08/21/2023    8:52 AM 05/02/2022   10:32 AM  Call back number  Post procedure Call Back phone  # [phone]  Permission to leave phone message Yes Yes     Patient questions:  Message left to call us if necessary.

## 2023-08-25 LAB — SURGICAL PATHOLOGY

## 2023-08-27 ENCOUNTER — Encounter: Payer: Self-pay | Admitting: Gastroenterology

## 2023-08-28 ENCOUNTER — Other Ambulatory Visit (INDEPENDENT_AMBULATORY_CARE_PROVIDER_SITE_OTHER): Payer: 59

## 2023-08-28 DIAGNOSIS — K6389 Other specified diseases of intestine: Secondary | ICD-10-CM | POA: Diagnosis not present

## 2023-08-28 LAB — CREATININE, SERUM: Creatinine, Ser: 0.88 mg/dL (ref 0.40–1.20)

## 2023-08-28 LAB — BUN: BUN: 18 mg/dL (ref 6–23)

## 2023-08-29 ENCOUNTER — Ambulatory Visit (HOSPITAL_BASED_OUTPATIENT_CLINIC_OR_DEPARTMENT_OTHER)
Admission: RE | Admit: 2023-08-29 | Discharge: 2023-08-29 | Disposition: A | Discharge status: Home / Self Care | Payer: 59 | Source: Ambulatory Visit | Attending: Gastroenterology | Admitting: Gastroenterology

## 2023-08-29 DIAGNOSIS — K6389 Other specified diseases of intestine: Secondary | ICD-10-CM | POA: Diagnosis present

## 2023-08-29 MED ORDER — IOHEXOL 300 MG/ML  SOLN
100.0000 mL | Freq: Once | INTRAMUSCULAR | Status: AC | PRN
  Administered 2023-08-29: 100 mL via INTRAVENOUS

## 2023-08-31 ENCOUNTER — Encounter: Payer: Self-pay | Admitting: Gastroenterology

## 2023-10-05 ENCOUNTER — Ambulatory Visit: Payer: Self-pay | Admitting: Surgery

## 2023-10-05 DIAGNOSIS — R739 Hyperglycemia, unspecified: Secondary | ICD-10-CM

## 2023-11-16 NOTE — Patient Instructions (Signed)
 DUE TO COVID-19 ONLY TWO VISITORS  (aged 50 and older)  ARE ALLOWED TO COME WITH YOU AND STAY IN THE WAITING ROOM ONLY DURING PRE OP AND PROCEDURE.   **NO VISITORS ARE ALLOWED IN THE SHORT STAY AREA OR RECOVERY ROOM!!**  IF YOU WILL BE ADMITTED INTO THE HOSPITAL YOU ARE ALLOWED ONLY FOUR SUPPORT PEOPLE DURING VISITATION HOURS ONLY (7 AM -8PM)   The support person(s) must pass our screening, gel in and out, and wear a mask at all times, including in the patient's room. Patients must also wear a mask when staff or their support person are in the room. Visitors GUEST BADGE MUST BE WORN VISIBLY  One adult visitor may remain with you overnight and MUST be in the room by 8 P.M.     Your procedure is scheduled on: 11/25/23   Report to [facility] Main Entrance    Report to admitting at : 10:15 AM   Call this number if you have problems the morning of surgery [phone]   Clear liquids starting the day before surgery until : 9:30 AM DAY OF SURGERY  Water Black Coffee (sugar ok, NO MILK/CREAM OR CREAMERS)  Tea (sugar ok, NO MILK/CREAM OR CREAMERS) regular and decaf                             Plain Jell-O (NO RED)                                           Fruit ices (not with fruit pulp, NO RED)                                     Popsicles (NO RED)                                                                  Juice: apple, WHITE grape, WHITE cranberry Sports drinks like Gatorade (NO RED)              Drink 2 Ensure drinks AT 10:00 PM the night before surgery.     The day of surgery:  Drink ONE (1) Pre-Surgery Clear Ensure at : 9:30 AM the morning of surgery. Drink in one sitting. Do not sip.  This drink was given to you during your hospital  pre-op appointment visit. Nothing else to drink after completing the  Pre-Surgery Clear Ensure or G2.          If you have questions, please contact your surgeon's office.  FOLLOW BOWEL PREP AND ANY ADDITIONAL PRE OP INSTRUCTIONS YOU  RECEIVED FROM YOUR SURGEON'S OFFICE!!!   Oral Hygiene is also important to reduce your risk of infection.                                    Remember - BRUSH YOUR TEETH THE MORNING OF SURGERY WITH YOUR REGULAR TOOTHPASTE  DENTURES WILL BE REMOVED PRIOR TO SURGERY PLEASE DO NOT APPLY  Poly grip OR ADHESIVES!!!   Do NOT smoke after Midnight   Take these medicines the morning of surgery with A SIP OF WATER: sronyx.                              You may not have any metal on your body including hair pins, jewelry, and body piercing             Do not wear make-up, lotions, powders, perfumes/cologne, or deodorant  Do not wear nail polish including gel and S&S, artificial/acrylic nails, or any other type of covering on natural nails including finger and toenails. If you have artificial nails, gel coating, etc. that needs to be removed by a nail salon please have this removed prior to surgery or surgery may need to be canceled/ delayed if the surgeon/ anesthesia feels like they are unable to be safely monitored.   Do not shave  48 hours prior to surgery.    Do not bring valuables to the hospital. [facility] IS NOT             RESPONSIBLE   FOR VALUABLES.   Contacts, glasses, or bridgework may not be worn into surgery.   Bring small overnight bag day of surgery.   DO NOT BRING YOUR HOME MEDICATIONS TO THE HOSPITAL. PHARMACY WILL DISPENSE MEDICATIONS LISTED ON YOUR MEDICATION LIST TO YOU DURING YOUR ADMISSION IN THE HOSPITAL!    Patients discharged on the day of surgery will not be allowed to drive home.  Someone NEEDS to stay with you for the first 24 hours after anesthesia.   Special Instructions: Bring a copy of your healthcare power of attorney and living will documents         the day of surgery if you haven't scanned them before.              Please read over the following fact sheets you were given: IF YOU HAVE QUESTIONS ABOUT YOUR PRE-OP INSTRUCTIONS PLEASE CALL [phone]     [facility] - Preparing for Surgery Before surgery, you can play an important role.  Because skin is not sterile, your skin needs to be as free of germs as possible.  You can reduce the number of germs on your skin by washing with CHG (chlorahexidine gluconate) soap before surgery.  CHG is an antiseptic cleaner which kills germs and bonds with the skin to continue killing germs even after washing. Please DO NOT use if you have an allergy to CHG or antibacterial soaps.  If your skin becomes reddened/irritated stop using the CHG and inform your nurse when you arrive at Short Stay. Do not shave (including legs and underarms) for at least 48 hours prior to the first CHG shower.  You may shave your face/neck. Please follow these instructions carefully:  1.  Shower with CHG Soap the night before surgery and the  morning of Surgery.  2.  If you choose to wash your hair, wash your hair first as usual with your  normal  shampoo.  3.  After you shampoo, rinse your hair and body thoroughly to remove the  shampoo.                           4.  Use CHG as you would any other liquid soap.  You can apply chg directly  to the skin and wash  Gently with a scrungie or clean washcloth.  5.  Apply the CHG Soap to your body ONLY FROM THE NECK DOWN.   Do not use on face/ open                           Wound or open sores. Avoid contact with eyes, ears mouth and genitals (private parts).                       Wash face,  Genitals (private parts) with your normal soap.             6.  Wash thoroughly, paying special attention to the area where your surgery  will be performed.  7.  Thoroughly rinse your body with warm water from the neck down.  8.  DO NOT shower/wash with your normal soap after using and rinsing off  the CHG Soap.                9.  Pat yourself dry with a clean towel.            10.  Wear clean pajamas.            11.  Place clean sheets on your bed the night of your first shower and  do not  sleep with pets. Day of Surgery : Do not apply any lotions/deodorants the morning of surgery.  Please wear clean clothes to the hospital/surgery center.  FAILURE TO FOLLOW THESE INSTRUCTIONS MAY RESULT IN THE CANCELLATION OF YOUR SURGERY PATIENT SIGNATURE_________________________________  NURSE SIGNATURE__________________________________  ________________________________________________________________________  Nasario Exon  An incentive spirometer is a tool that can help keep your lungs clear and active. This tool measures how well you are filling your lungs with each breath. Taking long deep breaths may help reverse or decrease the chance of developing breathing (pulmonary) problems (especially infection) following: A long period of time when you are unable to move or be active. BEFORE THE PROCEDURE  If the spirometer includes an indicator to show your best effort, your nurse or respiratory therapist will set it to a desired goal. If possible, sit up straight or lean slightly forward. Try not to slouch. Hold the incentive spirometer in an upright position. INSTRUCTIONS FOR USE  Sit on the edge of your bed if possible, or sit up as far as you can in bed or on a chair. Hold the incentive spirometer in an upright position. Breathe out normally. Place the mouthpiece in your mouth and seal your lips tightly around it. Breathe in slowly and as deeply as possible, raising the piston or the ball toward the top of the column. Hold your breath for 3-5 seconds or for as long as possible. Allow the piston or ball to fall to the bottom of the column. Remove the mouthpiece from your mouth and breathe out normally. Rest for a few seconds and repeat Steps 1 through 7 at least 10 times every 1-2 hours when you are awake. Take your time and take a few normal breaths between deep breaths. The spirometer may include an indicator to show your best effort. Use the indicator as a goal to work  toward during each repetition. After each set of 10 deep breaths, practice coughing to be sure your lungs are clear. If you have an incision (the cut made at the time of surgery), support your incision when coughing by placing a pillow or rolled up towels firmly  against it. Once you are able to get out of bed, walk around indoors and cough well. You may stop using the incentive spirometer when instructed by your caregiver.  RISKS AND COMPLICATIONS Take your time so you do not get dizzy or light-headed. If you are in pain, you may need to take or ask for pain medication before doing incentive spirometry. It is harder to take a deep breath if you are having pain. AFTER USE Rest and breathe slowly and easily. It can be helpful to keep track of a log of your progress. Your caregiver can provide you with a simple table to help with this. If you are using the spirometer at home, follow these instructions: SEEK MEDICAL CARE IF:  You are having difficultly using the spirometer. You have trouble using the spirometer as often as instructed. Your pain medication is not giving enough relief while using the spirometer. You develop fever of 100.5 F (38.1 C) or higher. SEEK IMMEDIATE MEDICAL CARE IF:  You cough up bloody sputum that had not been present before. You develop fever of 102 F (38.9 C) or greater. You develop worsening pain at or near the incision site. MAKE SURE YOU:  Understand these instructions. Will watch your condition. Will get help right away if you are not doing well or get worse. Document Released: 03/09/2007 Document Revised: 01/19/2012 Document Reviewed: 05/10/2007 [facility] Patient Information 2014 [place], [place].   ________________________________________________________________________

## 2023-11-17 ENCOUNTER — Encounter (HOSPITAL_COMMUNITY)
Admission: RE | Admit: 2023-11-17 | Discharge: 2023-11-17 | Disposition: A | Discharge status: Home / Self Care | Payer: 59 | Source: Ambulatory Visit | Attending: Surgery | Admitting: Surgery

## 2023-11-17 ENCOUNTER — Other Ambulatory Visit: Payer: Self-pay

## 2023-11-17 ENCOUNTER — Encounter (HOSPITAL_COMMUNITY): Payer: Self-pay

## 2023-11-17 VITALS — BP 132/86 | HR 93 | Temp 98.6°F | Ht 66.0 in | Wt 142.0 lb

## 2023-11-17 DIAGNOSIS — Z01812 Encounter for preprocedural laboratory examination: Secondary | ICD-10-CM | POA: Insufficient documentation

## 2023-11-17 DIAGNOSIS — Z01818 Encounter for other preprocedural examination: Secondary | ICD-10-CM

## 2023-11-17 DIAGNOSIS — R739 Hyperglycemia, unspecified: Secondary | ICD-10-CM | POA: Diagnosis not present

## 2023-11-17 LAB — CBC
HCT: 43.5 % (ref 36.0–46.0)
Hemoglobin: 14.5 g/dL (ref 12.0–15.0)
MCH: 31.9 pg (ref 26.0–34.0)
MCHC: 33.3 g/dL (ref 30.0–36.0)
MCV: 95.8 fL (ref 80.0–100.0)
Platelets: 208 10*3/uL (ref 150–400)
RBC: 4.54 MIL/uL (ref 3.87–5.11)
RDW: 13 % (ref 11.5–15.5)
WBC: 6 10*3/uL (ref 4.0–10.5)
nRBC: 0 % (ref 0.0–0.2)

## 2023-11-17 LAB — BASIC METABOLIC PANEL
Anion gap: 10 (ref 5–15)
BUN: 16 mg/dL (ref 6–20)
CO2: 22 mmol/L (ref 22–32)
Calcium: 9.3 mg/dL (ref 8.9–10.3)
Chloride: 105 mmol/L (ref 98–111)
Creatinine, Ser: 0.76 mg/dL (ref 0.44–1.00)
GFR, Estimated: >60 mL/min (ref >60)
Glucose, Bld: 110 mg/dL — ABNORMAL HIGH (ref 70–99)
Potassium: 3.8 mmol/L (ref 3.5–5.1)
Sodium: 137 mmol/L (ref 135–145)

## 2023-11-17 NOTE — Progress Notes (Signed)
 For Anesthesia: PCP - Allwardt, Mardy HERO, PA-C  Cardiologist - N/A  Bowel Prep reminder: Reviewed  Chest x-ray -  EKG -  Stress Test -  ECHO -  Cardiac Cath -  Pacemaker/ICD device last checked: Pacemaker orders received: Device Rep notified:  Spinal Cord Stimulator:N/A  Sleep Study - N/A CPAP -   Fasting Blood Sugar - N/A Checks Blood Sugar _____ times a day Date and result of last Hgb A1c-  Last dose of GLP1 agonist- N/A GLP1 instructions:   Last dose of SGLT-2 inhibitors- N/A SGLT-2 instructions:   Blood Thinner Instructions:N/A Aspirin Instructions: Last Dose:  Activity level: Can go up a flight of stairs and activities of daily living without stopping and without chest pain and/or shortness of breath   Able to exercise without chest pain and/or shortness of breath   Anesthesia review:   Patient denies shortness of breath, fever, cough and chest pain at PAT appointment   Patient verbalized understanding of instructions that were given to them at the PAT appointment. Patient was also instructed that they will need to review over the PAT instructions again at home before surgery.

## 2023-11-18 LAB — HEMOGLOBIN A1C
Hgb A1c MFr Bld: 5.7 % — ABNORMAL HIGH (ref 4.8–5.6)
Mean Plasma Glucose: 117 mg/dL

## 2023-11-25 ENCOUNTER — Inpatient Hospital Stay (HOSPITAL_COMMUNITY)
Admission: RE | Admit: 2023-11-25 | Discharge: 2023-11-25 | DRG: 331 | Disposition: A | Discharge status: Home / Self Care | Payer: 59 | Source: Ambulatory Visit | Attending: Surgery | Admitting: Surgery

## 2023-11-25 ENCOUNTER — Inpatient Hospital Stay (HOSPITAL_COMMUNITY): Payer: 59 | Admitting: Anesthesiology

## 2023-11-25 ENCOUNTER — Other Ambulatory Visit: Payer: Self-pay

## 2023-11-25 ENCOUNTER — Encounter (HOSPITAL_COMMUNITY): Payer: Self-pay | Admitting: Surgery

## 2023-11-25 ENCOUNTER — Encounter (HOSPITAL_COMMUNITY)
Admission: RE | Disposition: A | Discharge status: Home / Self Care | Payer: Self-pay | Source: Ambulatory Visit | Attending: Surgery

## 2023-11-25 DIAGNOSIS — K36 Other appendicitis: Secondary | ICD-10-CM

## 2023-11-25 DIAGNOSIS — Z8249 Family history of ischemic heart disease and other diseases of the circulatory system: Secondary | ICD-10-CM

## 2023-11-25 DIAGNOSIS — K561 Intussusception: Principal | ICD-10-CM | POA: Diagnosis present

## 2023-11-25 DIAGNOSIS — E782 Mixed hyperlipidemia: Secondary | ICD-10-CM | POA: Diagnosis present

## 2023-11-25 DIAGNOSIS — K37 Unspecified appendicitis: Principal | ICD-10-CM | POA: Diagnosis present

## 2023-11-25 DIAGNOSIS — Z01818 Encounter for other preprocedural examination: Secondary | ICD-10-CM

## 2023-11-25 HISTORY — PX: LAPAROSCOPIC APPENDECTOMY: SHX408

## 2023-11-25 LAB — TYPE AND SCREEN
ABO/RH(D): O POS
Antibody Screen: NEGATIVE

## 2023-11-25 LAB — ABO/RH: ABO/RH(D): O POS

## 2023-11-25 LAB — POCT PREGNANCY, URINE: Preg Test, Ur: NEGATIVE

## 2023-11-25 SURGERY — APPENDECTOMY, LAPAROSCOPIC
Anesthesia: General

## 2023-11-25 MED ORDER — ONDANSETRON HCL 4 MG/2ML IJ SOLN
INTRAMUSCULAR | Status: DC | PRN
  Administered 2023-11-25: 4 mg via INTRAVENOUS

## 2023-11-25 MED ORDER — BUPIVACAINE LIPOSOME 1.3 % IJ SUSP: 20.0000 mL | Freq: Once | INTRAMUSCULAR | Status: DC

## 2023-11-25 MED ORDER — TRAMADOL HCL 50 MG PO TABS
ORAL_TABLET | ORAL | Status: AC
  Administered 2023-11-25: 50 mg via ORAL
  Filled 2023-11-25: qty 1

## 2023-11-25 MED ORDER — BUPIVACAINE LIPOSOME 1.3 % IJ SUSP
INTRAMUSCULAR | Status: AC
  Filled 2023-11-25: qty 20

## 2023-11-25 MED ORDER — ONDANSETRON HCL 4 MG/2ML IJ SOLN: 4.0000 mg | Freq: Once | INTRAMUSCULAR | Status: DC | PRN

## 2023-11-25 MED ORDER — SCOPOLAMINE 1 MG/3DAYS TD PT72
1.0000 | MEDICATED_PATCH | Freq: Once | TRANSDERMAL | Status: DC
  Administered 2023-11-25: 1.5 mg via TRANSDERMAL
  Filled 2023-11-25: qty 1

## 2023-11-25 MED ORDER — METRONIDAZOLE 500 MG PO TABS: 1000.0000 mg | ORAL_TABLET | ORAL | Status: DC

## 2023-11-25 MED ORDER — CHLORHEXIDINE GLUCONATE 0.12 % MT SOLN
15.0000 mL | Freq: Once | OROMUCOSAL | Status: AC
  Administered 2023-11-25: 15 mL via OROMUCOSAL

## 2023-11-25 MED ORDER — PROPOFOL 10 MG/ML IV BOLUS
INTRAVENOUS | Status: DC | PRN
  Administered 2023-11-25: 110 mg via INTRAVENOUS

## 2023-11-25 MED ORDER — FENTANYL CITRATE (PF) 100 MCG/2ML IJ SOLN
INTRAMUSCULAR | Status: AC
  Filled 2023-11-25: qty 2

## 2023-11-25 MED ORDER — POLYETHYLENE GLYCOL 3350 17 GM/SCOOP PO POWD: 1.0000 | Freq: Once | ORAL | Status: DC

## 2023-11-25 MED ORDER — DEXAMETHASONE SODIUM PHOSPHATE 10 MG/ML IJ SOLN
INTRAMUSCULAR | Status: DC | PRN
  Administered 2023-11-25: 8 mg via INTRAVENOUS

## 2023-11-25 MED ORDER — ACETAMINOPHEN 500 MG PO TABS
1000.0000 mg | ORAL_TABLET | ORAL | Status: AC
  Administered 2023-11-25: 1000 mg via ORAL
  Filled 2023-11-25: qty 2

## 2023-11-25 MED ORDER — BUPIVACAINE-EPINEPHRINE 0.25% -1:200000 IJ SOLN
INTRAMUSCULAR | Status: AC
  Filled 2023-11-25: qty 1

## 2023-11-25 MED ORDER — ENSURE PRE-SURGERY PO LIQD: 592.0000 mL | Freq: Once | ORAL | Status: DC

## 2023-11-25 MED ORDER — NEOMYCIN SULFATE 500 MG PO TABS: 1000.0000 mg | ORAL_TABLET | ORAL | Status: DC

## 2023-11-25 MED ORDER — LACTATED RINGERS IV SOLN: INTRAVENOUS | Status: DC

## 2023-11-25 MED ORDER — MIDAZOLAM HCL 2 MG/2ML IJ SOLN
INTRAMUSCULAR | Status: AC
Start: 2023-11-25 — End: ?
  Filled 2023-11-25: qty 2

## 2023-11-25 MED ORDER — TRAMADOL HCL 50 MG PO TABS: 50.0000 mg | ORAL_TABLET | Freq: Four times a day (QID) | ORAL | 0 refills | Status: AC | PRN

## 2023-11-25 MED ORDER — LIDOCAINE HCL (PF) 2 % IJ SOLN
INTRAMUSCULAR | Status: AC
  Filled 2023-11-25: qty 5

## 2023-11-25 MED ORDER — GABAPENTIN 100 MG PO CAPS
200.0000 mg | ORAL_CAPSULE | ORAL | Status: AC
  Administered 2023-11-25: 200 mg via ORAL
  Filled 2023-11-25: qty 2

## 2023-11-25 MED ORDER — PROPOFOL 10 MG/ML IV BOLUS
INTRAVENOUS | Status: AC
  Filled 2023-11-25: qty 20

## 2023-11-25 MED ORDER — BUPIVACAINE LIPOSOME 1.3 % IJ SUSP
INTRAMUSCULAR | Status: DC | PRN
  Administered 2023-11-25: 20 mL

## 2023-11-25 MED ORDER — ONDANSETRON HCL 4 MG/2ML IJ SOLN
INTRAMUSCULAR | Status: AC
  Filled 2023-11-25: qty 2

## 2023-11-25 MED ORDER — ROCURONIUM BROMIDE 100 MG/10ML IV SOLN
INTRAVENOUS | Status: DC | PRN
  Administered 2023-11-25: 40 mg via INTRAVENOUS

## 2023-11-25 MED ORDER — DEXAMETHASONE SODIUM PHOSPHATE 10 MG/ML IJ SOLN
INTRAMUSCULAR | Status: AC
Start: 2023-11-25 — End: ?
  Filled 2023-11-25: qty 1

## 2023-11-25 MED ORDER — ALVIMOPAN 12 MG PO CAPS
12.0000 mg | ORAL_CAPSULE | ORAL | Status: AC
  Administered 2023-11-25: 12 mg via ORAL
  Filled 2023-11-25: qty 1

## 2023-11-25 MED ORDER — SODIUM CHLORIDE 0.9 % IV SOLN
2.0000 g | INTRAVENOUS | Status: AC
  Administered 2023-11-25: 2 g via INTRAVENOUS
  Filled 2023-11-25: qty 2

## 2023-11-25 MED ORDER — 0.9 % SODIUM CHLORIDE (POUR BTL) OPTIME
TOPICAL | Status: DC | PRN
  Administered 2023-11-25: 1000 mL

## 2023-11-25 MED ORDER — BISACODYL 5 MG PO TBEC: 20.0000 mg | DELAYED_RELEASE_TABLET | Freq: Once | ORAL | Status: DC

## 2023-11-25 MED ORDER — ENOXAPARIN SODIUM 40 MG/0.4ML IJ SOSY
40.0000 mg | PREFILLED_SYRINGE | Freq: Once | INTRAMUSCULAR | Status: AC
  Administered 2023-11-25: 40 mg via SUBCUTANEOUS
  Filled 2023-11-25: qty 0.4

## 2023-11-25 MED ORDER — ORAL CARE MOUTH RINSE: 15.0000 mL | Freq: Once | OROMUCOSAL | Status: AC

## 2023-11-25 MED ORDER — CELECOXIB 200 MG PO CAPS
200.0000 mg | ORAL_CAPSULE | ORAL | Status: AC
  Administered 2023-11-25: 200 mg via ORAL
  Filled 2023-11-25: qty 1

## 2023-11-25 MED ORDER — ROCURONIUM BROMIDE 10 MG/ML (PF) SYRINGE
PREFILLED_SYRINGE | INTRAVENOUS | Status: AC
Start: 2023-11-25 — End: ?
  Filled 2023-11-25: qty 10

## 2023-11-25 MED ORDER — SUGAMMADEX SODIUM 200 MG/2ML IV SOLN
INTRAVENOUS | Status: DC | PRN
  Administered 2023-11-25: 200 mg via INTRAVENOUS

## 2023-11-25 MED ORDER — MIDAZOLAM HCL 5 MG/5ML IJ SOLN
INTRAMUSCULAR | Status: DC | PRN
  Administered 2023-11-25: 2 mg via INTRAVENOUS

## 2023-11-25 MED ORDER — PHENYLEPHRINE HCL (PRESSORS) 10 MG/ML IV SOLN
INTRAVENOUS | Status: DC | PRN
  Administered 2023-11-25: 160 ug via INTRAVENOUS
  Administered 2023-11-25: 80 ug via INTRAVENOUS
  Administered 2023-11-25: 160 ug via INTRAVENOUS

## 2023-11-25 MED ORDER — FENTANYL CITRATE (PF) 100 MCG/2ML IJ SOLN
INTRAMUSCULAR | Status: DC | PRN
  Administered 2023-11-25 (×2): 100 ug via INTRAVENOUS

## 2023-11-25 MED ORDER — BUPIVACAINE-EPINEPHRINE 0.25% -1:200000 IJ SOLN
INTRAMUSCULAR | Status: DC | PRN
  Administered 2023-11-25: 50 mL

## 2023-11-25 MED ORDER — AMISULPRIDE (ANTIEMETIC) 5 MG/2ML IV SOLN: 10.0000 mg | Freq: Once | INTRAVENOUS | Status: DC | PRN

## 2023-11-25 MED ORDER — TRAMADOL HCL 50 MG PO TABS: 50.0000 mg | ORAL_TABLET | Freq: Once | ORAL | Status: AC | PRN

## 2023-11-25 MED ORDER — LIDOCAINE HCL (CARDIAC) PF 100 MG/5ML IV SOSY
PREFILLED_SYRINGE | INTRAVENOUS | Status: DC | PRN
  Administered 2023-11-25: 60 mg via INTRAVENOUS

## 2023-11-25 MED ORDER — ENSURE PRE-SURGERY PO LIQD: 296.0000 mL | Freq: Once | ORAL | Status: DC

## 2023-11-25 MED ORDER — FENTANYL CITRATE PF 50 MCG/ML IJ SOSY
PREFILLED_SYRINGE | INTRAMUSCULAR | Status: AC
  Administered 2023-11-25: 50 ug via INTRAVENOUS
  Filled 2023-11-25: qty 3

## 2023-11-25 MED ORDER — FENTANYL CITRATE PF 50 MCG/ML IJ SOSY
25.0000 ug | PREFILLED_SYRINGE | INTRAMUSCULAR | Status: DC | PRN
  Administered 2023-11-25 (×2): 50 ug via INTRAVENOUS

## 2023-11-25 MED ORDER — LACTATED RINGERS IV SOLN: INTRAVENOUS | Status: DC | PRN

## 2023-11-25 SURGICAL SUPPLY — 55 items
APPLIER CLIP 5 13 M/L LIGAMAX5 (MISCELLANEOUS) IMPLANT
APPLIER CLIP ROT 10 11.4 M/L (STAPLE) IMPLANT
BAG COUNTER SPONGE SURGICOUNT (BAG) IMPLANT
CABLE HIGH FREQUENCY MONO STRZ (ELECTRODE) IMPLANT
CHLORAPREP W/TINT 26 (MISCELLANEOUS) ×1 IMPLANT
CLIP APPLIE 5 13 M/L LIGAMAX5 (MISCELLANEOUS) IMPLANT
CLIP APPLIE ROT 10 11.4 M/L (STAPLE) IMPLANT
COVER SURGICAL LIGHT HANDLE (MISCELLANEOUS) ×1 IMPLANT
DRAPE WARM FLUID 44X44 (DRAPES) ×1 IMPLANT
DRSG TEGADERM 2-3/8X2-3/4 SM (GAUZE/BANDAGES/DRESSINGS) ×2 IMPLANT
DRSG TEGADERM 4X4.75 (GAUZE/BANDAGES/DRESSINGS) ×1 IMPLANT
ELECT REM PT RETURN 15FT ADLT (MISCELLANEOUS) ×1 IMPLANT
ENDOLOOP SUT PDS II 0 18 (SUTURE) IMPLANT
GAUZE SPONGE 2X2 8PLY STRL LF (GAUZE/BANDAGES/DRESSINGS) ×1 IMPLANT
GLOVE ECLIPSE 8.0 STRL XLNG CF (GLOVE) ×1 IMPLANT
GLOVE INDICATOR 8.0 STRL GRN (GLOVE) ×1 IMPLANT
GOWN STRL REUS W/ TWL XL LVL3 (GOWN DISPOSABLE) ×1 IMPLANT
HANDLE SUCTION POOLE (INSTRUMENTS) IMPLANT
IRRIG SUCT STRYKERFLOW 2 WTIP (MISCELLANEOUS) IMPLANT
IRRIGATION SUCT STRKRFLW 2 WTP (MISCELLANEOUS) IMPLANT
KIT BASIN OR (CUSTOM PROCEDURE TRAY) ×1 IMPLANT
KIT TURNOVER KIT A (KITS) IMPLANT
NDL INSUFFLATION 14GA 120MM (NEEDLE) IMPLANT
NEEDLE INSUFFLATION 14GA 120MM (NEEDLE) IMPLANT
PAD POSITIONING PINK XL (MISCELLANEOUS) ×1 IMPLANT
PENCIL SMOKE EVACUATOR (MISCELLANEOUS) IMPLANT
POUCH RETRIEVAL ECOSAC 10 (ENDOMECHANICALS) IMPLANT
RELOAD STAPLE 60 2.6 WHT THN (STAPLE) IMPLANT
RELOAD STAPLE 60 3.6 BLU REG (STAPLE) IMPLANT
RELOAD STAPLE 60 4.1 GRN THCK (STAPLE) IMPLANT
SCISSORS LAP 5X35 DISP (ENDOMECHANICALS) ×1 IMPLANT
SEALER TISSUE G2 STRG ARTC 35C (ENDOMECHANICALS) ×1 IMPLANT
SET TUBE SMOKE EVAC HIGH FLOW (TUBING) ×1 IMPLANT
SLEEVE ADV FIXATION 5X100MM (TROCAR) IMPLANT
SPIKE FLUID TRANSFER (MISCELLANEOUS) ×1 IMPLANT
STAPLE ECHEON FLEX 60 POW ENDO (STAPLE) IMPLANT
STAPLER RELOAD BLUE 60MM (STAPLE) ×2 IMPLANT
STAPLER RELOAD GREEN 60MM (STAPLE) IMPLANT
STAPLER RELOAD WHITE 60MM (STAPLE) IMPLANT
SUCTION POOLE HANDLE (INSTRUMENTS) IMPLANT
SUT MNCRL AB 4-0 PS2 18 (SUTURE) ×1 IMPLANT
SUT PDS AB 0 CT1 36 (SUTURE) IMPLANT
SUT PDS AB 1 CT1 27 (SUTURE) ×1 IMPLANT
SUT SILK 2 0 SH (SUTURE) IMPLANT
SUT VLOC 180 2-0 6IN GS21 (SUTURE) IMPLANT
SYS WOUND ALEXIS 18CM MED (MISCELLANEOUS) IMPLANT
SYSTEM WOUND ALEXIS 18CM MED (MISCELLANEOUS) IMPLANT
TOWEL OR 17X26 10 PK STRL BLUE (TOWEL DISPOSABLE) ×1 IMPLANT
TRAY FOLEY MTR SLVR 14FR STAT (SET/KITS/TRAYS/PACK) ×1 IMPLANT
TRAY FOLEY MTR SLVR 16FR STAT (SET/KITS/TRAYS/PACK) IMPLANT
TRAY LAPAROSCOPIC (CUSTOM PROCEDURE TRAY) ×1 IMPLANT
TROCAR ADV FIXATION 12X100MM (TROCAR) ×1 IMPLANT
TROCAR ADV FIXATION 5X100MM (TROCAR) ×1 IMPLANT
TROCAR Z-THREAD OPTICAL 5X100M (TROCAR) ×1 IMPLANT
YANKAUER SUCT BULB TIP 10FT TU (MISCELLANEOUS) IMPLANT

## 2023-11-25 NOTE — Transfer of Care (Signed)
 Immediate Anesthesia Transfer of Care Note  Patient: Jordan Bell  Procedure(s) Performed: APPENDECTOMY LAPAROSCOPIC/PARTIAL CECECTOMY  Patient Location: PACU  Anesthesia Type:General  Level of Consciousness: drowsy  Airway & Oxygen Therapy: Patient Spontanous Breathing and Patient connected to face mask oxygen  Post-op Assessment: Report given to RN and Post -op Vital signs reviewed and stable  Post vital signs: Reviewed and stable  Last Vitals:  Vitals Value Taken Time  BP 137/76 11/25/23 1317  Temp 36.8 C 11/25/23 1316  Pulse 78 11/25/23 1319  Resp 15 11/25/23 1319  SpO2 100 % 11/25/23 1319  Vitals shown include unfiled device data.  Last Pain:  Vitals:   11/25/23 1040  TempSrc: Oral         Complications: No notable events documented.

## 2023-11-25 NOTE — Anesthesia Preprocedure Evaluation (Addendum)
 Anesthesia Evaluation  Patient identified by MRN, date of birth, ID band Patient awake    Reviewed: Allergy & Precautions, NPO status , Patient's Chart, lab work & pertinent test results  Airway Mallampati: I  TM Distance: >3 FB Neck ROM: Full    Dental  (+) Teeth Intact, Dental Advisory Given   Pulmonary neg pulmonary ROS   Pulmonary exam normal breath sounds clear to auscultation       Cardiovascular Exercise Tolerance: Good negative cardio ROS Normal cardiovascular exam Rhythm:Regular Rate:Normal     Neuro/Psych negative neurological ROS     GI/Hepatic Neg liver ROS,,,MASS OF APPENDIX   Endo/Other  negative endocrine ROS    Renal/GU negative Renal ROS     Musculoskeletal negative musculoskeletal ROS (+)    Abdominal   Peds  Hematology negative hematology ROS (+)   Anesthesia Other Findings Day of surgery medications reviewed with the patient.  Reproductive/Obstetrics                             Anesthesia Physical Anesthesia Plan  ASA: 1  Anesthesia Plan: General   Post-op Pain Management: Tylenol  PO (pre-op)*   Induction: Intravenous  PONV Risk Score and Plan: 4 or greater and Scopolamine  patch - Pre-op, Midazolam , Dexamethasone  and Ondansetron   Airway Management Planned: Oral ETT  Additional Equipment:   Intra-op Plan:   Post-operative Plan: Extubation in OR  Informed Consent: I have reviewed the patients History and Physical, chart, labs and discussed the procedure including the risks, benefits and alternatives for the proposed anesthesia with the patient or authorized representative who has indicated his/her understanding and acceptance.     Dental advisory given  Plan Discussed with: CRNA  Anesthesia Plan Comments:         Anesthesia Quick Evaluation

## 2023-11-25 NOTE — Interval H&P Note (Signed)
 History and Physical Interval Note:  11/25/2023 11:46 AM  Jordan Bell  has presented today for surgery, with the diagnosis of MASS OF APPENDIX.  The various methods of treatment have been discussed with the patient and family. After consideration of risks, benefits and other options for treatment, the patient has consented to  Procedure(s): APPENDECTOMY LAPAROSCOPIC/PARTIAL CECECTOMY, POSSIBLE PROXIMAL COLECTOMY (N/A) as a surgical intervention.  The patient's history has been reviewed, patient examined, no change in status, stable for surgery.  I have reviewed the patient's chart and labs.  Questions were answered to the patient's satisfaction.    I have re-reviewed the the patient's records, history, medications, and allergies.  I have re-examined the patient.  I again discussed intraoperative plans and goals of post-operative recovery.  The patient agrees to proceed.  Jordan Bell  1976/07/08 [id number]  Patient Care Team: Allwardt, Deleta Felix, PA-C as PCP - General (Physician Assistant) Candyce Champagne, MD as Consulting Physician (General Surgery) Asencion Blacksmith, MD (Inactive) as Consulting Physician (Gastroenterology)  Patient Active Problem List   Diagnosis Date Noted   Diverticulosis 01/08/2023   Mixed hyperlipidemia 01/08/2023   Uses oral contraception 12/18/2013   Lipid screening 12/18/2013   Other malaise and fatigue 12/18/2013   Cough 12/18/2013   Need for prophylactic vaccination and inoculation against influenza 10/24/2013    Past Medical History:  Diagnosis Date   Diverticulosis    High cholesterol     Past Surgical History:  Procedure Laterality Date   COLONOSCOPY Bilateral 04/2022   hepatic flexure ulcer, hyperplastic polyp, [facility] Rhea at [facility]   TONSILLECTOMY      Social History   Socioeconomic History   Marital status: Married    Spouse name: Lyell Samuel    Number of children: 3   Years of education: 18   Highest education level: Not on file  Occupational  History   Occupation: SOCIAL WORKER    Employer: [employer]  Tobacco Use   Smoking status: Never   Smokeless tobacco: Never  Vaping Use   Vaping status: Never Used  Substance and Sexual Activity   Alcohol use: Yes    Alcohol/week: 7.0 standard drinks of alcohol    Types: 7 Glasses of wine per week    Comment: One Glass of wine daily   Drug use: Never   Sexual activity: Yes    Partners: Male    Birth control/protection: None  Other Topics Concern   Not on file  Social History Narrative   Marital Status:  Married Multimedia programmer)    Children:  Daughters (Brooke 6, Sarah 4 and Geographical information systems officer 2)    Pets: Dog (1)    Living Situation: Lives with husband and children    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    Drug Use:  None   Diet:  Regular (No pork or beef)    Exercise:  Gym (Weight, Cardio)  twice weekly    Hobbies: Gardening               Social Drivers of Health   Financial Resource Strain: Low Risk  (01/21/2023)   Received from [facility], [facility]   Overall Financial Resource Strain (CARDIA)    Difficulty of Paying Living Expenses: Not hard at all  Food Insecurity: No Food Insecurity (01/21/2023)   Received  [facility], [facility]   Hunger Vital Sign    Worried About Running Out of Food in the Last Year: Never true    Ran Out of Food in the Last Year: Never true  Transportation Needs: No Transportation Needs (01/21/2023)   Received from [facility], [facility]   PRAPARE - Transportation    Lack of Transportation (Medical): No    Lack of Transportation (Non-Medical): No  Physical Activity: Insufficiently Active (01/21/2023)   Received from [facility], [facility]   Exercise Vital Sign    Days of Exercise per Week: 3 days    Minutes of Exercise per Session: 20 min  Stress: No Stress Concern Present  (01/21/2023)   Received from [facility], [facility] - Occupational Stress Questionnaire    Feeling of Stress : Not at all  Social Connections: Socially Integrated (01/21/2023)   Received from [facility], [facility]   Social Network    How would you rate your social network (family, work, friends)?: Good participation with social networks  Intimate Partner Violence: Not At Risk (01/21/2023)   Received from [facility], [facility]   HITS    Over the last 12 months how often did your partner physically hurt you?: Never    Over the last 12 months how often did your partner insult you or talk down to you?: Never    Over the last 12 months how often did your partner threaten you with physical harm?: Never    Over the last 12 months how often did your partner scream or curse at you?: Never    Family History  Problem Relation Age of Onset   Parkinson's disease Mother    Thyroid disease Mother        Hypothyroidism   Cancer - Other Father        blood cancer   Scoliosis Maternal Grandmother    Heart disease Maternal Grandfather    Colon cancer Neg Hx    Colon polyps Neg Hx    Esophageal cancer Neg Hx    Stomach cancer Neg Hx    Rectal cancer Neg Hx     Medications Prior to Admission  Medication Sig Dispense Refill Last Dose/Taking   SRONYX 0.1-20 MG-MCG tablet Take 1 tablet by mouth daily.   11/24/2023    Current Facility-Administered Medications  Medication Dose Route Frequency Provider Last Rate Last Admin   bupivacaine  liposome (EXPAREL ) 1.3 % injection 266 mg  20 mL Infiltration Once Candyce Champagne, MD       cefoTEtan  (CEFOTAN ) 2 g in sodium chloride  0.9 % 100 mL IVPB  2 g Intravenous On Call to OR Candyce Champagne, MD       Cecily Cohen ON 11/26/2023] feeding supplement (ENSURE PRE-SURGERY) liquid 296 mL  296 mL Oral Once Candyce Champagne, MD       feeding supplement (ENSURE PRE-SURGERY) liquid 592 mL  592 mL Oral Once Candyce Champagne, MD        lactated ringers  infusion   Intravenous Continuous Treen, Jon R, MD 10 mL/hr at 11/25/23 1116 New Bag at 11/25/23 1116   scopolamine  (TRANSDERM-SCOP) 1 MG/3DAYS 1.5 mg  1 patch Transdermal Once Erin Havers, MD   1.5 mg at 11/25/23 1115     No Known Allergies  Pulse 73   Temp 98.2 F (36.8 C) (Oral)   Resp 16   Ht 5\' 6"  (1.676 m)   Wt 62.6 kg   LMP  (  LMP Unknown) Comment: POCT urine negative 11/25/23  SpO2 98%   BMI 22.27 kg/m   Labs: Results for orders placed or performed during the hospital encounter of 11/25/23 (from the past 48 hours)  Pregnancy, urine POC     Status: None   Collection Time: 11/25/23 10:45 AM  Result Value Ref Range   Preg Test, Ur NEGATIVE NEGATIVE    Comment:        THE SENSITIVITY OF THIS METHODOLOGY IS >24 mIU/mL   ABO/Rh     Status: None (Preliminary result)   Collection Time: 11/25/23 11:17 AM  Result Value Ref Range   ABO/RH(D) PENDING     Imaging / Studies: No results found.   Eddye Goodie, M.D., F.A.C.S. Gastrointestinal and Minimally Invasive Surgery [facility] [address] [place], [place] [address] [phone] Main / Paging  11/25/2023 11:46 AM    Eddye Goodie

## 2023-11-25 NOTE — Anesthesia Procedure Notes (Signed)
 Procedure Name: Intubation Date/Time: 11/25/2023 12:02 PM  Performed by: Josetta Niece, CRNAPre-anesthesia Checklist: Patient identified, Emergency Drugs available, Suction available, Patient being monitored and Timeout performed Patient Re-evaluated:Patient Re-evaluated prior to induction Oxygen Delivery Method: Circle system utilized Preoxygenation: Pre-oxygenation with 100% oxygen Induction Type: IV induction Ventilation: Mask ventilation without difficulty Laryngoscope Size: Mac and 3 Grade View: Grade I Tube size: 7.0 mm Number of attempts: 1 Airway Equipment and Method: Stylet Placement Confirmation: ETT inserted through vocal cords under direct vision, positive ETCO2 and breath sounds checked- equal and bilateral Secured at: 20 cm Tube secured with: Tape Dental Injury: Teeth and Oropharynx as per pre-operative assessment

## 2023-11-25 NOTE — H&P (Signed)
 11/25/2023   REFERRING PHYSICIAN: Celestino Cole., MD  Patient Care Team: Alda Amas as PCP - General  PROVIDER: Girtha Lama, MD  [facility] MRN: [id number] DOB: [birth year]  SUBJECTIVE   Chief Complaint: New Consultation   Jordan Bell is a 48 y.o. female  who is seen today as an office consultation  at the request of DrSandrea Cruel  for evaluation of periappendiceal mass.  History of Present Illness:  48 year old female. Underwent screening colonoscopy in 2023 with Dr. Savannah Curlin who is since left the region. Hepatic flexure had some ulceration. Biopsies benign. Hyperplastic polyp removed. Seems like based on concerns for the ulceration follow-up colonoscopy in a year recommended. Dr. Sandrea Cruel performed that. Most of the colon underwhelming but it seemed like there was extrinsic bulging at the appendiceal orifice. Biopsies again did not show any mass or tumor but seemed atypical. CT scan did not show any obvious colitis, colon mass, appendiceal mass. No extrinsic mass or mucin. Surgical consultation requested  Patient comes in with her husband. She usually moves her bowels every day. No bad bouts of constipation or diarrhea. No inflammatory bowel disease. No recent travels out of country or change in diet. No food intolerances or issues with wheat or dairy. No Crohn's. She is rather active and can walk several miles without any difficulty. She is never had any abdominal surgery. No diabetes or sleep apnea. Otherwise very healthy.  Medical History:  History reviewed. No pertinent past medical history.  Patient Active Problem List  Diagnosis  Mass of appendix   Past Surgical History:  Procedure Laterality Date  TONSILLECTOMY    No Known Allergies  Current Outpatient Medications on File Prior to Visit  Medication Sig Dispense Refill  SRONYX 0.1-20 mg-mcg tablet   No current facility-administered medications on file prior to visit.   Family History  Problem Relation  Age of Onset  Hyperlipidemia (Elevated cholesterol) Mother  Skin cancer Father    Social History   Tobacco Use  Smoking Status Never  Smokeless Tobacco Never    Social History   Socioeconomic History  Marital status: Married  Tobacco Use  Smoking status: Never  Smokeless tobacco: Never  Vaping Use  Vaping status: Never Used  Substance and Sexual Activity  Alcohol use: Not Currently  Drug use: Never   Social Drivers of Health   Financial Resource Strain: Low Risk (01/21/2023)  Received from [facility]  Overall Financial Resource Strain (CARDIA)  Difficulty of Paying Living Expenses: Not hard at all  Food Insecurity: No Food Insecurity (01/21/2023)  Received from [facility]  Hunger Vital Sign  Worried About Running Out of Food in the Last Year: Never true  Ran Out of Food in the Last Year: Never true  Transportation Needs: No Transportation Needs (01/21/2023)  [facility] - [facility]  Lack of Transportation (Medical): No  Lack of Transportation (Non-Medical): No  Physical Activity: Insufficiently Active (01/21/2023)  Received from [facility]  Exercise Vital Sign  Days of Exercise per Week: 3 days  Minutes of Exercise per Session: 20 min  Stress: No Stress Concern Present (01/21/2023)  Received from [facility] - Occupational Stress Questionnaire  Feeling of Stress : Not at all  Social Connections: Socially Integrated (01/21/2023)  Received from [facility]  Social Network  How would you rate your social network (family, work, friends)?: Good participation with social networks  Housing Stability: Low Risk (01/21/2023)  Received from [facility]  Housing Stability Vital Sign  Unable to Pay for Housing in the Last Year: No  Number of Places Lived in the Last Year: 1  Unstable Housing in the Last Year: No   ############################################################  Review of  Systems: A complete review of systems (ROS) was obtained from the patient.  We have reviewed this information and discussed as appropriate with the patient.  See HPI as well for other pertinent ROS.  Constitutional: No fevers, chills, sweats. Weight stable Eyes: No vision changes, No discharge HENT: No sore throats, nasal drainage Lymph: No neck swelling, No bruising easily Pulmonary: No cough, productive sputum CV: No orthopnea, PND . No exertional chest/neck/shoulder/arm pain. Patient can walk >5 miles without difficulty.   GI: No personal nor family history of GI/colon cancer, inflammatory bowel disease, irritable bowel syndrome, allergy such as Celiac Sprue, dietary/dairy problems, colitis, ulcers nor gastritis. No recent sick contacts/gastroenteritis. No travel outside the country. No changes in diet.  Renal: No UTIs, No hematuria Genital: No drainage, bleeding, masses Musculoskeletal: No severe joint pain. Good ROM major joints Skin: No sores or lesions Heme/Lymph: No easy bleeding. No swollen lymph nodes Neuro: No active seizures. No facial droop Psych: No hallucinations. No agitation  OBJECTIVE   Vitals:  10/05/23 1614  BP: 129/85  Pulse: 70  Temp: 36.8 C (98.2 F)  SpO2: 98%  Weight: 62.9 kg (138 lb 9.6 oz)  Height: 167.6 cm (5\' 6" )  PainSc: 0-No pain   Body mass index is 22.37 kg/m.  PHYSICAL EXAM:  Constitutional: Not cachectic. Hygeine adequate. Vitals signs as above.  Eyes: No glasses. Vision adequate,Pupils reactive, normal extraocular movements. Sclera nonicteric Neuro: CN II-XII intact. No major focal sensory defects. No major motor deficits. Lymph: No head/neck/groin lymphadenopathy Psych: No severe agitation. No severe anxiety. Judgment & insight Adequate, Oriented x4, HENT: Normocephalic, Mucus membranes moist. No thrush. Hearing: adequate Neck: Supple, No tracheal deviation. No obvious thyromegaly Chest: No pain to chest wall compression. Good  respiratory excursion. No audible wheezing CV: Pulses intact. regular. No major extremity edema Ext: No obvious deformity or contracture. Edema: Not present. No cyanosis Skin: No major subcutaneous nodules. Warm and dry Musculoskeletal: Severe joint rigidity not present. No obvious clubbing. No digital petechiae. Mobility: no assist device moving easily without restrictions  Abdomen: Flat Soft. Nondistended. Nontender. Hernia: Not present. Diastasis recti: Not present. No hepatomegaly. No splenomegaly.  Genital/Pelvic: Inguinal hernia: Not present. Inguinal lymph nodes: without lymphadenopathy nor hidradenitis.   Rectal: (Deferred)    ###################################################################  Labs, Imaging and Diagnostic Testing:  Located in 'Care Everywhere' section of Epic EMR chart  PRIOR [facility] NOTES:  Not applicable  SURGERY NOTES:  Not applicable  PATHOLOGY:  Located in 'Care Everywhere' section of Epic EMR chart  Assessment and Plan:  DIAGNOSES:  Diagnoses and all orders for this visit:  Mass of appendix    ASSESSMENT/PLAN  Intussusception of the appendix and the cecum suspicious for mass effect. Biopsies inconclusive. No major tumor or appendiceal mucinous neoplasm on CAT scan.  Atypical presentation but seems isolated. Not well-seen on CAT scan arguing against a major tumor mass. I would be inclined to resect that region.  I was able to review films and colonoscopy and discussed with my colorectal partner in the office, Dr. Beatris Lincoln, who agrees. Hopefully just an appendectomy with partial cecal wall resection. If it is just that, hopefully an outpatient surgery. However, given the intussusception and bulging, small chance that it may require ileocolectomy as a more  standard hospital stay of many days. Most likely will have to decide intraoperatively. Had a long discussion with the patient and her husband. We explained differential diagnosis  such as GIST, neuroendocrine tumor, benign mass, appendiceal polyp. Etc.  The anatomy & physiology of the digestive tract was discussed. The pathophysiology of the colon was discussed. Differential diagnosis of appendiceal/cecal mass is discussed. Natural history risks without surgery was discussed. I feel the risks of no intervention will lead to serious problems that outweigh the operative risks; therefore, I recommended an appendectomy with partial cecal wall resection and possible ileocolectomy to remove the pathology. Minimally invasive (Robotic/Laparoscopic) & open techniques were discussed.   Risks such as bleeding, infection, abscess, leak, reoperation, injury to other organs, need for repair of tissues / organs, possible ostomy, hernia, heart attack, stroke, death, and other risks were discussed. I noted a good likelihood this will help address the problem. Goals of post-operative recovery were discussed as well. Need for adequate nutrition, daily bowel regimen and healthy physical activity, to optimize recovery was noted as well. We will work to minimize complications. Educational materials were available as well. Questions were answered. The patient expresses understanding & wishes to proceed with surgery.

## 2023-11-25 NOTE — Op Note (Signed)
 PATIENT:  Jordan Bell  48 y.o. female  Patient Care Team: Allwardt, Deleta Felix, PA-C as PCP - General (Physician Assistant) Candyce Champagne, MD as Consulting Physician (General Surgery) Asencion Blacksmith, MD (Inactive) as Consulting Physician (Gastroenterology)  PRE-OPERATIVE DIAGNOSIS:  INTUSSUSCEPTION OF APPENDIX, POSSIBLE MASS  POST-OPERATIVE DIAGNOSIS:  CHRONIC APPENDICITIS  PROCEDURE:  LAPAROSCOPIC APPENDECTOMY WITH PARTIAL CECECTOMY  SURGEON:  Eddye Goodie, MD  ASSISTANT:  (n/a)   ANESTHESIA:  General endotracheal intubation anesthesia (GETA) and Local & regional field block at incision(s) for perioperative & postoperative pain control provided with liposomal bupivacaine  (Experel) 20mL mixed with 30mL of bupivicaine 0.25% with epinephrine   Estimated Blood Loss (EBL):   Total I/O In: 400 [I.V.:400] Out: 60 [Urine:50; Blood:10].   (See anesthesia record)  Delay start of Pharmacological VTE agent (>24hrs) due to concerns of significant anemia, surgical blood loss, or risk of bleeding?:  no  DRAINS: (None)  SPECIMEN:  Appendix & cecal wall  DISPOSITION OF SPECIMEN:  Pathology  COUNTS:  Sponge, needle, & instrument counts CORRECT  PLAN OF CARE: Discharge to home after PACU  PATIENT DISPOSITION:  PACU - hemodynamically stable.   INDICATIONS: Patient underwent screening colonoscopy and seemed to have intussusception of appendix going into the cecum.  No definite polyp.  CAT scan really gets any major mass or mucocele but concerning.  I recommended diagnostic laparoscopy with probable appendectomy but possible ileocolonic resection depending on intraoperative findings:  The anatomy & physiology of the digestive tract was discussed.  The pathophysiology of appendixl masses & appendicitis was discussed.  Natural history risks without surgery was discussed.   I feel the risks of no intervention will lead to serious problems that outweigh the operative risks; therefore, I  recommended diagnostic laparoscopy with removal of appendix to remove the pathology.  Laparoscopic & open techniques were discussed.   I noted a good likelihood this will help address the problem.    Risks such as bleeding, infection, abscess, leak, reoperation, possible ostomy, hernia, heart attack, death, and other risks were discussed.  Goals of post-operative recovery were discussed as well.  We will work to minimize complications.  Questions were answered.  The patient expresses understanding & wishes to proceed with surgery.  OR FINDINGS: Foreshorten inflamed appendix densely adherent to right fallopian tube.  No mucin nodularity or carcinomatosis.  Consistent more with chronic appendicitis.  Ileocecal valve clear without any mucin mass or tumor.  No adenomatous or serrated polyp.  Ileum and cecum and rest of digestive tract noninflamed with no concerning.  No Carson ptosis or mucin noted.  CASE DATA:  Type of patient?: Elective [facility] Private Case  Status of Case? Elective Scheduled  Infection Present At Time Of Surgery (PATOS)?  NO  DESCRIPTION:   Informed consent was confirmed.  The patient underwent general anaesthesia without difficulty.  The patient was positioned appropriately.  VTE prevention in place.  The patient's abdomen was clipped, prepped, & draped in a sterile fashion.  Surgical timeout confirmed our plan.  Peritoneal entry with a laparoscopic port was obtained using Varess spring needle entry technique in the left upper abdomen as the patient was positioned in reverse Trendelenburg.  I induced carbon dioxide insufflation.  No change in end tidal CO2 measurements.  Full symmetrical abdominal distention.  Initial port was carefully placed using optical entry technique..  Camera inspection revealed no injury.  Extra ports were carefully placed under direct laparoscopic visualization.     I did inspection and noted no evidence of any  infection inflammation or abscess.  No mucin  or carcinomatosis.  I was able to find the ileocecal region.  Could see a foreshortened appendix adherent to the right lateral pelvis and especially the right fallopian tube.  There is no nodularity.  No definite abscess.  Seem consistent with adhesions from prior or chronic appendicitis.  The cecum did not seem particularly inflamed nor nodular  I proceeded with appendectomy with partial cecal wall resection to start.  Delcia Feeling off the terminal ileum mesentery off the retroperitoneum and off the ureter and ovarian vessels and psoas.  Mobilized a lateral medial fashion until only the appendiceal attachments to the right adnexa were noted.  Used cold scissors to carefully free off some adhesions and confirmed there was no abscess mass or any major concern.  Eventually freed the foreshortened thickened chronically irritated appendix off the right adnexa and retroperitoneum to good result.  We encountered no perforation mucin or abscess.  Delcia Feeling off a few extra adhesions closer to the falciform ligament.  I elevated the appendix. I transected through the mesoappendix and cleared off the ileocecal mesentery to expose the ileocecal region and especially the appendix.. I was able to free off the base of the appendix which was still viable.  I stapled the appendix off the cecum using a laparoscopic stapler x 2 firings. I took a healthy cuff of viable cecum. I ligated the mesoappendix and assured hemostasis in the mesentery.  I placed the appendix inside an EcoSac bag and removed out the 12mm stapler port.  I inspected the appendix and opened up the cecal wall closely to the staple margin line.  I even treated the appendiceal orifice and it was smooth without any nodularity.  There was no mucin.  No serrated polyp.  Mild thickening but no distinct nodule such as a neuroendocrine tumor GIST.  He did not seem consistent with a LAMN/mucocele.  I decided there was no need for further resection at this time.  I did copious  irrigation. Hemostasis was good in the mesoappendix, colon mesentery, and retroperitoneum.  Ovaries were appropriate.  Fallopian tube looked benign on the right side with no source of inflammation abscess nor tubo-ovarian abscess so I did not feel need for resection at this time.  Staple line was intact on the cecum with no bleeding. I washed out the pelvis, retrohepatic space and right paracolic gutter. I washed out the left side as well.  Hemostasis is good. There was no perforation or injury. Because the area cleaned up well after irrigation, I did not place a drain.  I closed the 12 mm stapler suprapubic port site fascia with #1 PDS suture using a suture passer under direct laparoscopic visualization.   I aspirated the carbon dioxide. Ports removed.  I closed skin using 4-0 monocryl stitch.  Sterile dressings applied.  Patient was extubated and sent to the recovery room.  I discussed operative findings, updated the patient's status, discussed probable steps to recovery, and gave postoperative recommendations to the patient's spouse, Location manager.  Recommendations were made.  Questions were answered.  He expressed understanding & appreciation.  Questions answered. They expressed understanding and appreciation.  Eddye Goodie, M.D., F.A.C.S. Gastrointestinal and Minimally Invasive Surgery [facility] [address] [place], [place] [address] [phone] Main / Paging  11/25/2023 1:18 PM

## 2023-11-25 NOTE — Anesthesia Postprocedure Evaluation (Signed)
 Anesthesia Post Note  Patient: Jordan Bell  Procedure(s) Performed: APPENDECTOMY LAPAROSCOPIC/PARTIAL CECECTOMY     Patient location during evaluation: PACU Anesthesia Type: General Level of consciousness: awake and alert Pain management: pain level controlled Vital Signs Assessment: post-procedure vital signs reviewed and stable Respiratory status: spontaneous breathing, nonlabored ventilation and respiratory function stable Cardiovascular status: blood pressure returned to baseline and stable Postop Assessment: no apparent nausea or vomiting Anesthetic complications: no   No notable events documented.  Last Vitals:  Vitals:   11/25/23 1419 11/25/23 1445  BP: 136/87 100/66  Pulse: (!) 54 62  Resp:    Temp: 36.5 C   SpO2: 100% 98%    Last Pain:  Vitals:   11/25/23 1419  TempSrc: Oral  PainSc: 3                  Erin Havers

## 2023-11-25 NOTE — Discharge Instructions (Signed)
 SURGERY: POST OP INSTRUCTIONS (Surgery for small bowel obstruction, colon resection, etc)   ######################################################################  EAT Gradually transition to a high fiber diet with a fiber supplement over the next few days after discharge  WALK Walk an hour a day.  Control your pain to do that.    CONTROL PAIN Control pain so that you can walk, sleep, tolerate sneezing/coughing, go up/down stairs.  HAVE A BOWEL MOVEMENT DAILY Keep your bowels regular to avoid problems.  OK to try a laxative to override constipation.  OK to use an antidairrheal to slow down diarrhea.  Call if not better after 2 tries  CALL IF YOU HAVE PROBLEMS/CONCERNS Call if you are still struggling despite following these instructions. Call if you have concerns not answered by these instructions  ######################################################################   DIET Follow a light diet the first few days at home.  Start with a bland diet such as soups, liquids, starchy foods, low fat foods, etc.  If you feel full, bloated, or constipated, stay on a ful liquid or pureed/blenderized diet for a few days until you feel better and no longer constipated. Be sure to drink plenty of fluids every day to avoid getting dehydrated (feeling dizzy, not urinating, etc.). Gradually add a fiber supplement to your diet over the next week.  Gradually get back to a regular solid diet.  Avoid fast food or heavy meals the first week as you are more likely to get nauseated. It is expected for your digestive tract to need a few months to get back to normal.  It is common for your bowel movements and stools to be irregular.  You will have occasional bloating and cramping that should eventually fade away.  Until you are eating solid food normally, off all pain medications, and back to regular activities; your bowels will not be normal. Focus on eating a low-fat, high fiber diet the rest of your life  (See Getting to Good Bowel Health, below).  CARE of your INCISION or WOUND  It is good for closed incisions and even open wounds to be washed every day.  Shower every day.  Short baths are fine.  Wash the incisions and wounds clean with soap & water.    You may leave closed incisions open to air if it is dry.   You may cover the incision with clean gauze & replace it after your daily shower for comfort.  TEGADERM:  You have clear gauze band-aid dressings over your closed incision(s).  Remove the dressings 2 days after surgery = 11/17.    If you have an open wound with a wound vac, see wound vac care instructions.    ACTIVITIES as tolerated Start light daily activities --- self-care, walking, climbing stairs-- beginning the day after surgery.  Gradually increase activities as tolerated.  Control your pain to be active.  Stop when you are tired.  Ideally, walk several times a day, eventually an hour a day.   Most people are back to most day-to-day activities in a few weeks.  It takes 4-8 weeks to get back to unrestricted, intense activity. If you can walk 30 minutes without difficulty, it is safe to try more intense activity such as jogging, treadmill, bicycling, low-impact aerobics, swimming, etc. Save the most intensive and strenuous activity for last (Usually 4-8 weeks after surgery) such as sit-ups, heavy lifting, contact sports, etc.  Refrain from any intense heavy lifting or straining until you are off narcotics for pain control.  You will have off  days, but things should improve week-by-week. DO NOT PUSH THROUGH PAIN.  Let pain be your guide: If it hurts to do something, don't do it.  Pain is your body warning you to avoid that activity for another week until the pain goes down. You may drive when you are no longer taking narcotic prescription pain medication, you can comfortably wear a seatbelt, and you can safely make sudden turns/stops to protect yourself without hesitating due to  pain. You may have sexual intercourse when it is comfortable. If it hurts to do something, stop.   MEDICATIONS Take your usually prescribed home medications unless otherwise directed.    Blood thinners:  You can restart any strong blood thinners after the second postoperative day  for example: COUMADIN (warfarin), XERELTO (rivaroxaban), ELIQUIS (apixaban), PLAVIX (clopidigrel), BRILINTA (ticagrelor), EFFIENT (prasugrel), PRADAXA (dabigatran), etc  Continue aspirin before & after surgery..     Some oozing/bleeding the first 1-2 weeks is common but should taper down & be small volume.    If you are passing many large clots or having uncontrolling bleeding, call your surgeon    PAIN CONTROL Pain after surgery or related to activity is often due to strain/injury to muscle, tendon, nerves and/or incisions.  This pain is usually short-term and will improve in a few months.  To help speed the process of healing and to get back to regular activity more quickly, DO THE FOLLOWING THINGS TOGETHER: Increase activity gradually.  DO NOT PUSH THROUGH PAIN Use Ice and/or Heat Try Gentle Massage and/or Stretching Take over the counter pain medication Take Narcotic prescription pain medication for more severe pain  Good pain control = faster recovery.  It is better to take more medicine to be more active than to stay in bed all day to avoid medications.  Increase activity gradually Avoid heavy lifting at first, then increase to lifting as tolerated over the next 6 weeks. Do not "push through" the pain.  Listen to your body and avoid positions and maneuvers than reproduce the pain.  Wait a few days before trying something more intense Walking an hour a day is encouraged to help your body recover faster and more safely.  Start slowly and stop when getting sore.  If you can walk 30 minutes without stopping or pain, you can try more intense activity (running, jogging, aerobics, cycling, swimming,  treadmill, sex, sports, weightlifting, etc.) Remember: If it hurts to do it, then don't do it! Use Ice and/or Heat You will have swelling and bruising around the incisions.  This will take several weeks to resolve. Ice packs or heating pads (6-8 times a day, 30-60 minutes at a time) will help sooth soreness & bruising. Some people prefer to use ice alone, heat alone, or alternate between ice & heat.  Experiment and see what works best for you.  Consider trying ice for the first few days to help decrease swelling and bruising; then, switch to heat to help relax sore spots and speed recovery. Shower every day.  Short baths are fine.  It feels good!  Keep the incisions and wounds clean with soap & water.   Try Gentle Massage and/or Stretching Massage at the area of pain many times a day Stop if you feel pain - do not overdo it Take over the counter pain medication This helps the muscle and nerve tissues become less irritable and calm down faster Choose ONE of the following over-the-counter anti-inflammatory medications: Acetaminophen  500mg  tabs (Tylenol ) 1-2 pills with every meal  and just before bedtime (avoid if you have liver problems or if you have acetaminophen  in you narcotic prescription) Naproxen 220mg  tabs (ex. Aleve, Naprosyn) 1-2 pills twice a day (avoid if you have kidney, stomach, IBD, or bleeding problems) Ibuprofen 200mg  tabs (ex. Advil, Motrin) 3-4 pills with every meal and just before bedtime (avoid if you have kidney, stomach, IBD, or bleeding problems) Take with food/snack several times a day as directed for at least 2 weeks to help keep pain / soreness down & more manageable. Take Narcotic prescription pain medication for more severe pain A prescription for strong pain control is often given to you upon discharge (for example: oxycodone/Percocet, hydrocodone /Norco/Vicodin, or tramadol /Ultram ) Take your pain medication as prescribed. Be mindful that most narcotic prescriptions  contain Tylenol  (acetaminophen ) as well - avoid taking too much Tylenol . If you are having problems/concerns with the prescription medicine (does not control pain, nausea, vomiting, rash, itching, etc.), please call us  [phone] to see if we need to switch you to a different pain medicine that will work better for you and/or control your side effects better. If you need a refill on your pain medication, you must call the office before 4 pm and on weekdays only.  By federal law, prescriptions for narcotics cannot be called into a pharmacy.  They must be filled out on paper & picked up from our office by the patient or authorized caretaker.  Prescriptions cannot be filled after 4 pm nor on weekends.    WHEN TO CALL US  [phone] Severe uncontrolled or worsening pain  Fever over 101 F (38.5 C) Concerns with the incision: Worsening pain, redness, rash/hives, swelling, bleeding, or drainage Reactions / problems with new medications (itching, rash, hives, nausea, etc.) Nausea and/or vomiting Difficulty urinating Difficulty breathing Worsening fatigue, dizziness, lightheadedness, blurred vision Other concerns If you are not getting better after two weeks or are noticing you are getting worse, contact our office [phone] for further advice.  We may need to adjust your medications, re-evaluate you in the office, send you to the emergency room, or see what other things we can do to help. The clinic staff is available to answer your questions during regular business hours (8:30am-5pm).  Please don't hesitate to call and ask to speak to one of our nurses for clinical concerns.    A surgeon from [facility] is always on call at the hospitals 24 hours/day If you have a medical emergency, go to the nearest emergency room or call 911.  FOLLOW UP in our office One the day of your discharge from the hospital (or the next business weekday), please call [facility] to set up  or confirm an appointment to see your surgeon in the office for a follow-up appointment.  Usually it is 2-3 weeks after your surgery.   If you have skin staples at your incision(s), let the office know so we can set up a time in the office for the nurse to remove them (usually around 10 days after surgery). Make sure that you call for appointments the day of discharge (or the next business weekday) from the hospital to ensure a convenient appointment time. IF YOU HAVE DISABILITY OR FAMILY LEAVE FORMS, BRING THEM TO THE OFFICE FOR PROCESSING.  DO NOT GIVE THEM TO YOUR DOCTOR.  [facility] [address], [address], [place], [place]  [address] ? [phone] - Main [phone] - Toll Free,  [phone] - Fax [email or web address]  GETTING TO GOOD BOWEL HEALTH. It is expected for your digestive tract to need a few months to get back to normal.  It is common for your bowel movements and stools to be irregular.  You will have occasional bloating and cramping that should eventually fade away.  Until you are eating solid food normally, off all pain medications, and back to regular activities; your bowels will not be normal.   Avoiding constipation The goal: ONE SOFT BOWEL MOVEMENT A DAY!    Drink plenty of fluids.  Choose water first. TAKE A FIBER SUPPLEMENT EVERY DAY THE REST OF YOUR LIFE During your first week back home, gradually add back a fiber supplement every day Experiment which form you can tolerate.   There are many forms such as powders, tablets, wafers, gummies, etc Psyllium bran (Metamucil), methylcellulose (Citrucel), Miralax  or Glycolax , Benefiber, Flax Seed.  Adjust the dose week-by-week (1/2 dose/day to 6 doses a day) until you are moving your bowels 1-2 times a day.  Cut back the dose or try a different fiber product if it is giving you problems such as diarrhea or bloating. Sometimes a laxative is needed to help jump-start bowels if  constipated until the fiber supplement can help regulate your bowels.  If you are tolerating eating & you are farting, it is okay to try a gentle laxative such as double dose MiraLax , prune juice, or Milk of Magnesia.  Avoid using laxatives too often. Stool softeners can sometimes help counteract the constipating effects of narcotic pain medicines.  It can also cause diarrhea, so avoid using for too long. If you are still constipated despite taking fiber daily, eating solids, and a few doses of laxatives, call our office. Controlling diarrhea Try drinking liquids and eating bland foods for a few days to avoid stressing your intestines further. Avoid dairy products (especially milk & ice cream) for a short time.  The intestines often can lose the ability to digest lactose when stressed. Avoid foods that cause gassiness or bloating.  Typical foods include beans and other legumes, cabbage, broccoli, and dairy foods.  Avoid greasy, spicy, fast foods.  Every person has some sensitivity to other foods, so listen to your body and avoid those foods that trigger problems for you. Probiotics (such as active yogurt, Align, etc) may help repopulate the intestines and colon with normal bacteria and calm down a sensitive digestive tract Adding a fiber supplement gradually can help thicken stools by absorbing excess fluid and retrain the intestines to act more normally.  Slowly increase the dose over a few weeks.  Too much fiber too soon can backfire and cause cramping & bloating. It is okay to try and slow down diarrhea with a few doses of antidiarrheal medicines.   Bismuth subsalicylate (ex. Kayopectate, Pepto Bismol) for a few doses can help control diarrhea.  Avoid if pregnant.   Loperamide (Imodium) can slow down diarrhea.  Start with one tablet (2mg ) first.  Avoid if you are having fevers or severe pain.  ILEOSTOMY PATIENTS WILL HAVE CHRONIC DIARRHEA since their colon is not in use.    Drink plenty of liquids.   You will need to drink even more glasses of water/liquid a day to avoid getting dehydrated. Record output from your ileostomy.  Expect to empty the bag every 3-4 hours at first.  Most people with a permanent ileostomy empty their bag 4-6 times at the least.   Use antidiarrheal medicine (especially Imodium) several times a day to avoid getting dehydrated.  Start with a dose at bedtime & breakfast.  Adjust up or down as needed.  Increase antidiarrheal medications as directed to avoid emptying the bag more than 8 times a day (every 3 hours). Work with your wound ostomy nurse to learn care for your ostomy.  See ostomy care instructions. TROUBLESHOOTING IRREGULAR BOWELS 1) Start with a soft & bland diet. No spicy, greasy, or fried foods.  2) Avoid gluten/wheat or dairy products from diet to see if symptoms improve. 3) Miralax  17gm or flax seed mixed in 8oz. water or juice-daily. May use 2-4 times a day as needed. 4) Gas-X, Phazyme, etc. as needed for gas & bloating.  5) Prilosec (omeprazole) over-the-counter as needed 6)  Consider probiotics (Align, Activa, etc) to help calm the bowels down  Call your doctor if you are getting worse or not getting better.  Sometimes further testing (cultures, endoscopy, X-ray studies, CT scans, bloodwork, etc.) may be needed to help diagnose and treat the cause of the diarrhea. [facility] [address], [place], [place]  [address] [phone] - Main.    [phone]  - Toll Free.   [phone] - Fax [email or web address]

## 2023-11-26 ENCOUNTER — Encounter (HOSPITAL_COMMUNITY): Payer: Self-pay | Admitting: Surgery

## 2023-11-27 LAB — SURGICAL PATHOLOGY

## 2023-12-07 NOTE — Discharge Summary (Signed)
Physician Discharge Summary    Jordan Bell MRN: [id number] DOB/AGE: 05-23-1976 = 48 y.o.  Patient Care Team: Allwardt, Crist Infante, PA-C as PCP - General (Physician Assistant) Karie Soda, MD as Consulting Physician (General Surgery) Meryl Dare, MD (Inactive) as Consulting Physician (Gastroenterology)  Admit date: 11/25/2023  Discharge date: 11/25/2023 Hospital Stay = 0 days    Discharge Diagnoses:  Principal Problem:   Appendicitis   12 Days Post-Op  11/25/2023  POST-OPERATIVE DIAGNOSIS:   MASS OF APPENDIX  SURGERY:  11/25/2023  Procedure(s): APPENDECTOMY LAPAROSCOPIC/PARTIAL CECECTOMY  SURGEON:    Surgeon(s): Karie Soda, MD  Consults: Anesthesia  Hospital Course:   The patient underwent the surgery above.  Postoperatively, the patient recovered & PACU well.  I felt it was safe for the patient to be discharged from the hospital to further recover with close followup. Postoperative recommendations were discussed in detail.  They are written as well.  Admin insists on a d/c summary  Discharged Condition: good  Discharge Exam: Blood pressure 100/66, pulse 62, temperature 97.7 F (36.5 C), temperature source Oral, resp. rate 12, height 5\' 6"  (1.676 m), weight 62.6 kg, SpO2 98%.  General: Pt awake/alert/oriented x4 in No acute distress Eyes: PERRL, normal EOM.  Sclera clear.  No icterus Neuro: CN II-XII intact w/o focal sensory/motor deficits. Lymph: No head/neck/groin lymphadenopathy Psych:  No delerium/psychosis/paranoia HENT: Normocephalic, Mucus membranes moist.  No thrush Neck: Supple, No tracheal deviation Chest:  No chest wall pain w good excursion CV:  Pulses intact.  Regular rhythm MS: Normal AROM mjr joints.  No obvious deformity Abdomen: Soft.  Nondistended.  Mildly tender at incisions only.  No evidence of peritonitis.  No incarcerated hernias. Ext:  SCDs BLE.  No mjr edema.  No cyanosis Skin: No petechiae / purpura   Disposition:     Follow-up Information     Karie Soda, MD Follow up on 12/16/2023.   Specialties: General Surgery, Colon and Rectal Surgery Why: To follow up after your operation Contact information: [address] [place] [address] [phone]                 Discharge disposition: 01-Home or Self Care       Discharge Instructions     Call MD for:   Complete by: As directed    FEVER > 101.5 F  (temperatures < 101.5 F are not significant)   Call MD for:  extreme fatigue   Complete by: As directed    Call MD for:  persistant dizziness or light-headedness   Complete by: As directed    Call MD for:  persistant nausea and vomiting   Complete by: As directed    Call MD for:  redness, tenderness, or signs of infection (pain, swelling, redness, odor or green/yellow discharge around incision site)   Complete by: As directed    Call MD for:  severe uncontrolled pain   Complete by: As directed    Diet - low sodium heart healthy   Complete by: As directed    Start with a bland diet such as soups, liquids, starchy foods, low fat foods, etc. the first few days at home. Gradually advance to a solid, low-fat, high fiber diet by the end of the first week at home.   Add a fiber supplement to your diet (Metamucil, etc) If you feel full, bloated, or constipated, stay on a full liquid or pureed/blenderized diet for a few days until you feel better and are no longer  constipated.   Discharge instructions   Complete by: As directed    See Discharge Instructions If you are not getting better after two weeks or are noticing you are getting worse, contact our office [phone] for further advice.  We may need to adjust your medications, re-evaluate you in the office, send you to the emergency room, or see what other things we can do to help. The clinic staff is available to answer your questions during regular business hours (8:30am-5pm).  Please don't hesitate to call and ask to speak  to one of our nurses for clinical concerns.    A surgeon from [facility] is always on call at the hospitals 24 hours/day If you have a medical emergency, go to the nearest emergency room or call 911.   Discharge wound care:   Complete by: As directed    It is good for closed incisions and even open wounds to be washed every day.  Shower every day.  Short baths are fine.  Wash the incisions and wounds clean with soap & water.    You may leave closed incisions open to air if it is dry.   You may cover the incision with clean gauze & replace it after your daily shower for comfort.  TEGADERM:  You have clear gauze band-aid dressings over your closed incision(s).  Remove the dressings 2 days after surgery = 1/17 Friday   Driving Restrictions   Complete by: As directed    You may drive when: - you are no longer taking narcotic prescription pain medication - you can comfortably wear a seatbelt - you can safely make sudden turns/stops without pain.   Increase activity slowly   Complete by: As directed    Start light daily activities --- self-care, walking, climbing stairs- beginning the day after surgery.  Gradually increase activities as tolerated.  Control your pain to be active.  Stop when you are tired.  Ideally, walk several times a day, eventually an hour a day.   Most people are back to most day-to-day activities in a few weeks.  It takes 4-6 weeks to get back to unrestricted, intense activity. If you can walk 30 minutes without difficulty, it is safe to try more intense activity such as jogging, treadmill, bicycling, low-impact aerobics, swimming, etc. Save the most intensive and strenuous activity for last (Usually 4-8 weeks after surgery) such as sit-ups, heavy lifting, contact sports, etc.  Refrain from any intense heavy lifting or straining until you are off narcotics for pain control.  You will have off days, but things should improve week-by-week. DO NOT PUSH THROUGH PAIN.   Let pain be your guide: If it hurts to do something, don't do it.   Lifting restrictions   Complete by: As directed    If you can walk 30 minutes without difficulty, it is safe to try more intense activity such as jogging, treadmill, bicycling, low-impact aerobics, swimming, etc. Save the most intensive and strenuous activity for last (Usually 4-8 weeks after surgery) such as sit-ups, heavy lifting, contact sports, etc.   Refrain from any intense heavy lifting or straining until you are off narcotics for pain control.  You will have off days, but things should improve week-by-week. DO NOT PUSH THROUGH PAIN.  Let pain be your guide: If it hurts to do something, don't do it.  Pain is your body warning you to avoid that activity for another week until the pain goes down.   May shower /  Bathe   Complete by: As directed    May walk up steps   Complete by: As directed    Remove dressing in 48 hours   Complete by: As directed    Make sure all dressings have been removed on the second day after surgery = 11/17 Friday Leave incisions open to air.  OK to cover incisions with gauze or bandages as desired   Sexual Activity Restrictions   Complete by: As directed    You may have sexual intercourse when it is comfortable. If it hurts to do something, stop.       Allergies as of 11/25/2023   No Known Allergies      Medication List     TAKE these medications    Sronyx 0.1-20 MG-MCG tablet Generic drug: levonorgestrel-ethinyl estradiol Take 1 tablet by mouth daily.   traMADol 50 MG tablet Commonly known as: ULTRAM Take 1-2 tablets (50-100 mg total) by mouth every 6 (six) hours as needed for moderate pain (pain score 4-6) or severe pain (pain score 7-10).               Discharge Care Instructions  (From admission, onward)           Start     Ordered   11/25/23 0000  Discharge wound care:       Comments: It is good for closed incisions and even open wounds to be washed every day.   Shower every day.  Short baths are fine.  Wash the incisions and wounds clean with soap & water.    You may leave closed incisions open to air if it is dry.   You may cover the incision with clean gauze & replace it after your daily shower for comfort.  TEGADERM:  You have clear gauze band-aid dressings over your closed incision(s).  Remove the dressings 2 days after surgery = 1/17 Friday   11/25/23 1203            Significant Diagnostic Studies:  No results found for this or any previous visit (from the past 72 hours).  No results found.  Past Medical History:  Diagnosis Date   Diverticulosis    High cholesterol     Past Surgical History:  Procedure Laterality Date   COLONOSCOPY Bilateral 04/2022   hepatic flexure ulcer, hyperplastic polyp, Tressia Danas at [facility]   LAPAROSCOPIC APPENDECTOMY N/A 11/25/2023   Procedure: APPENDECTOMY LAPAROSCOPIC/PARTIAL CECECTOMY;  Surgeon: Karie Soda, MD;  Location: [facility];  Service: General;  Laterality: N/A;   TONSILLECTOMY      Social History   Socioeconomic History   Marital status: Married    Spouse name: Eric    Number of children: 3   Years of education: 18   Highest education level: Not on file  Occupational History   Occupation: SOCIAL WORKER    Employer: [employer]  Tobacco Use   Smoking status: Never   Smokeless tobacco: Never  Vaping Use   Vaping status: Never Used  Substance and Sexual Activity   Alcohol use: Yes    Alcohol/week: 7.0 standard drinks of alcohol    Types: 7 Glasses of wine per week    Comment: One Glass of wine daily   Drug use: Never   Sexual activity: Yes    Partners: Male    Birth control/protection: None  Other Topics Concern   Not on file  Social History Narrative   Marital Status:  Married Multimedia programmer)    Children:  Daughters (  Brooke 6, Sarah 4 and Kiara 2)    Pets: Dog (1)    Living Situation: Lives with husband and children    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    Drug Use:  None   Diet:  Regular (No pork or beef)    Exercise:  Gym (Weight, Cardio)  twice weekly    Hobbies: Gardening               Social Drivers of Health   Financial Resource Strain: Low Risk  (01/21/2023)   Received from [facility], [facility]   Overall Financial Resource Strain (CARDIA)    Difficulty of Paying Living Expenses: Not hard at all  Food Insecurity: No Food Insecurity (01/21/2023)   Received from [facility], [facility]   Hunger Vital Sign    Worried About Running Out of Food in the Last Year: Never true    Ran Out of Food in the Last Year: Never true  Transportation Needs: No Transportation Needs (01/21/2023)   Received from [facility], [facility]   PRAPARE - Transportation    Lack of Transportation (Medical): No    Lack of Transportation (Non-Medical): No  Physical Activity: Insufficiently Active (01/21/2023)   Received from [facility], [facility]   Exercise Vital Sign    Days of Exercise per Week: 3 days    Minutes of Exercise per Session: 20 min  Stress: No Stress Concern Present (01/21/2023)   Received from [facility], [facility] - Occupational Stress Questionnaire    Feeling of Stress : Not at all  Social Connections: Socially Integrated (01/21/2023)   Received from [facility], [facility]   Social Network    How would you rate your social network (family, work, friends)?: Good participation with social networks  Intimate Partner Violence: Not At Risk (01/21/2023)   Received from [facility], [facility]   HITS    Over the last 12 months how often did your partner physically hurt you?: Never    Over the last 12 months how often did your partner insult you or talk down to you?: Never    Over the last 12 months how often did your  partner threaten you with physical harm?: Never    Over the last 12 months how often did your partner scream or curse at you?: Never    Family History  Problem Relation Age of Onset   Parkinson's disease Mother    Thyroid disease Mother        Hypothyroidism   Cancer - Other Father        blood cancer   Scoliosis Maternal Grandmother    Heart disease Maternal Grandfather    Colon cancer Neg Hx    Colon polyps Neg Hx    Esophageal cancer Neg Hx    Stomach cancer Neg Hx    Rectal cancer Neg Hx     No current facility-administered medications for this encounter.   Current Outpatient Medications  Medication Sig Dispense Refill   SRONYX 0.1-20 MG-MCG tablet Take 1 tablet by mouth daily.     traMADol (ULTRAM) 50 MG tablet Take 1-2 tablets (50-100 mg total) by mouth every 6 (six) hours as needed for moderate pain (pain score 4-6) or severe  pain (pain score 7-10). 20 tablet 0     No Known Allergies  Signed:   Ardeth Sportsman, MD, FACS, MASCRS Esophageal, Gastrointestinal & Colorectal Surgery Robotic and Minimally Invasive Surgery  [facility] [address] [place], [place] [address] [phone] Fax [phone] Main  CONTACT INFORMATION: Weekday (9AM-5PM): Call [facility] main office at [phone] Weeknight (5PM-9AM) or Weekend/Holiday: Check EPIC "Web Links" tab & use "AMION" (password " [email or web address]") for General Surgery [facility] coverage  Please, DO NOT use SecureChat  (it is not reliable communication to reach operating surgeons & will lead to a delay in care).   Epic staff messaging available for outptient concerns needing 1-2 business day response.      12/07/2023, 8:37 AM

## 2024-03-28 LAB — HM MAMMOGRAPHY

## 2024-07-07 ENCOUNTER — Ambulatory Visit: Admitting: Physician Assistant

## 2024-07-07 VITALS — BP 120/74 | HR 79 | Temp 98.2°F | Ht 66.5 in | Wt 137.4 lb

## 2024-07-07 DIAGNOSIS — Z131 Encounter for screening for diabetes mellitus: Secondary | ICD-10-CM | POA: Diagnosis not present

## 2024-07-07 DIAGNOSIS — Z13220 Encounter for screening for lipoid disorders: Secondary | ICD-10-CM

## 2024-07-07 DIAGNOSIS — Z0000 Encounter for general adult medical examination without abnormal findings: Secondary | ICD-10-CM

## 2024-07-07 DIAGNOSIS — Z23 Encounter for immunization: Secondary | ICD-10-CM | POA: Diagnosis not present

## 2024-07-07 LAB — CBC WITH DIFFERENTIAL/PLATELET
Basophils Absolute: 0 K/uL (ref 0.0–0.1)
Basophils Relative: 0.5 % (ref 0.0–3.0)
Eosinophils Absolute: 0.1 K/uL (ref 0.0–0.7)
Eosinophils Relative: 1 % (ref 0.0–5.0)
HCT: 44.8 % (ref 36.0–46.0)
Hemoglobin: 14.8 g/dL (ref 12.0–15.0)
Lymphocytes Relative: 33.7 % (ref 12.0–46.0)
Lymphs Abs: 1.9 K/uL (ref 0.7–4.0)
MCHC: 33.1 g/dL (ref 30.0–36.0)
MCV: 94.3 fl (ref 78.0–100.0)
Monocytes Absolute: 0.3 K/uL (ref 0.1–1.0)
Monocytes Relative: 5.5 % (ref 3.0–12.0)
Neutro Abs: 3.4 K/uL (ref 1.4–7.7)
Neutrophils Relative %: 59.3 % (ref 43.0–77.0)
Platelets: 192 K/uL (ref 150.0–400.0)
RBC: 4.75 Mil/uL (ref 3.87–5.11)
RDW: 14.2 % (ref 11.5–15.5)
WBC: 5.8 K/uL (ref 4.0–10.5)

## 2024-07-07 LAB — LIPID PANEL
Cholesterol: 228 mg/dL — ABNORMAL HIGH (ref 0–200)
HDL: 64.9 mg/dL (ref >39.00)
LDL Cholesterol: 130 mg/dL — ABNORMAL HIGH (ref 0–99)
NonHDL: 162.8
Total CHOL/HDL Ratio: 4
Triglycerides: 166 mg/dL — ABNORMAL HIGH (ref 0.0–149.0)
VLDL: 33.2 mg/dL (ref 0.0–40.0)

## 2024-07-07 LAB — COMPREHENSIVE METABOLIC PANEL WITH GFR
ALT: 15 U/L (ref 0–35)
AST: 21 U/L (ref 0–37)
Albumin: 4.6 g/dL (ref 3.5–5.2)
Alkaline Phosphatase: 30 U/L — ABNORMAL LOW (ref 39–117)
BUN: 17 mg/dL (ref 6–23)
CO2: 25 meq/L (ref 19–32)
Calcium: 9.4 mg/dL (ref 8.4–10.5)
Chloride: 101 meq/L (ref 96–112)
Creatinine, Ser: 0.93 mg/dL (ref 0.40–1.20)
GFR: 71.78 mL/min (ref >60.00)
Glucose, Bld: 87 mg/dL (ref 70–99)
Potassium: 4 meq/L (ref 3.5–5.1)
Sodium: 138 meq/L (ref 135–145)
Total Bilirubin: 0.4 mg/dL (ref 0.2–1.2)
Total Protein: 7.6 g/dL (ref 6.0–8.3)

## 2024-07-07 LAB — TSH: TSH: 1.11 u[IU]/mL (ref 0.35–5.50)

## 2024-07-07 LAB — HEMOGLOBIN A1C: Hgb A1c MFr Bld: 6 % (ref 4.6–6.5)

## 2024-07-07 NOTE — Progress Notes (Signed)
 Patient ID: Jordan Bell, female    DOB: 06-01-1976, 48 y.o.   MRN: [id number]   Assessment & Plan:  Encounter for annual physical exam -     Hemoglobin A1c -     Lipid panel -     TSH -     Comprehensive metabolic panel with GFR -     CBC with Differential/Platelet  Immunization due -     Varicella-zoster vaccine IM -     Pneumococcal conjugate vaccine 20-valent      Assessment and Plan Assessment & Plan Adult Wellness Visit Routine adult wellness visit with stable vital signs and weight. No new habits such as smoking or vaping. Engages in regular exercise and maintains a healthy diet. Up to date with colonoscopy, mammogram, and Pap smear. No acute concerns or symptoms reported. Discussed potential side effects of the shingles vaccine, particularly the second dose, which may cause mild symptoms. 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 No prior symptoms of endometriosis reported. No current symptoms or complications.   Age-appropriate screening and counseling performed today. Will check labs and call with results. Preventive measures discussed and printed in AVS for patient.   Patient Counseling: [x]   Nutrition: Stressed importance of moderation in sodium/caffeine intake, saturated fat and cholesterol, caloric balance, sufficient intake of fresh fruits, vegetables, and fiber.  [x]   Stressed the importance of regular exercise.   []   Substance Abuse: Discussed cessation/primary prevention of tobacco, alcohol, or other drug use; driving or other dangerous activities under the influence; availability of treatment for abuse.   []   Injury prevention: Discussed safety belts, safety helmets, smoke detector, smoking near bedding or upholstery.   []    Sexuality: Discussed sexually transmitted diseases, partner selection, use of condoms, avoidance of unintended pregnancy  and contraceptive alternatives.   [x]   Dental health: Discussed importance of regular tooth brushing, flossing, and dental visits.  [x]   Health maintenance and immunizations reviewed. Please refer to Health maintenance section.        Return in about 1 year (around 07/07/2025) for physical.    Subjective:    Chief Complaint  Patient presents with   Annual Exam    HPI Discussed the use of AI scribe software for clinical note transcription with the patient, who gave verbal consent to proceed.  History of Present Illness Jordan Bell is a 48 year old female who presents for an annual physical exam.  Jordan Bell recently underwent a colonoscopy where a tumor was discovered, initially suspected to be in the appendix. The tumor was removed and diagnosed as endometriosis. Jordan Bell had no prior symptoms of endometriosis and was unaware of the condition before this finding.  Jordan Bell maintains a healthy lifestyle, walking daily and avoiding red meat, opting for vegetables and chicken instead. Her family follows a similar diet.  Jordan Bell has regular dermatology check-ups with no history of skin cancer. Jordan Bell is up to date with her colonoscopy, mammogram, and Pap smear, and has an upcoming OB GYN appointment.  Her A1c was noted to be 5.7 prior to her surgery, which was slightly higher than expected.  No new symptoms or concerns reported. No swelling, and Jordan Bell feels good overall.     Past Medical History:  Diagnosis Date   Diverticulosis    High cholesterol     Past Surgical History:  Procedure Laterality Date   COLONOSCOPY Bilateral 04/2022   hepatic flexure ulcer, hyperplastic polyp, Suzen Brass at [facility]   LAPAROSCOPIC APPENDECTOMY N/A 11/25/2023   Procedure: APPENDECTOMY LAPAROSCOPIC/PARTIAL CECECTOMY;  Surgeon: Sheldon Standing, MD;  Location: [facility];  Service: General;  Laterality:  N/A;   TONSILLECTOMY      Family History  Problem Relation Age of Onset   Parkinson's disease Mother    Thyroid disease Mother        Hypothyroidism   Cancer - Other Father        blood cancer   Scoliosis Maternal Grandmother    Heart disease Maternal Grandfather    Colon cancer Neg Hx    Colon polyps Neg Hx    Esophageal cancer Neg Hx    Stomach cancer Neg Hx    Rectal cancer Neg Hx     Social History   Tobacco Use   Smoking status: Never   Smokeless tobacco: Never  Vaping Use   Vaping status: Never Used  Substance Use Topics   Alcohol use: Yes    Alcohol/week: 7.0 standard drinks of alcohol    Types: 7 Glasses of wine per week    Comment: One Glass of wine daily   Drug use: Never     No Known Allergies  Review of Systems NEGATIVE UNLESS OTHERWISE INDICATED IN HPI      Objective:     BP 120/74   Pulse 79   Temp 98.2 F (36.8 C) (Oral)   Ht 5' 6.5 (1.689 m)   Wt 137 lb 6.4 oz (62.3 kg)   SpO2 96%   BMI 21.84 kg/m   Wt Readings from Last 3 Encounters:  07/07/24 137 lb 6.4 oz (62.3 kg)  11/25/23 138 lb (62.6 kg)  11/17/23 142 lb (64.4 kg)    BP Readings from Last 3 Encounters:  07/07/24 120/74  11/25/23 100/66  11/17/23 132/86     Physical Exam Vitals and nursing note reviewed.  Constitutional:      Appearance: Normal appearance. Jordan Bell is normal weight. Jordan Bell is not toxic-appearing.  HENT:     Head: Normocephalic and atraumatic.     Right Ear: Tympanic membrane, ear canal and external ear normal.     Left Ear: Tympanic membrane, ear canal and external ear normal.     Nose: Nose normal.     Mouth/Throat:     Mouth: Mucous membranes are moist.  Eyes:     Extraocular Movements: Extraocular movements intact.     Conjunctiva/sclera: Conjunctivae normal.     Pupils: Pupils are equal, round, and reactive to light.  Cardiovascular:     Rate and Rhythm: Normal rate and regular rhythm.     Pulses: Normal pulses.     Heart sounds: Normal heart  sounds.  Pulmonary:     Effort: Pulmonary effort is normal.     Breath sounds: Normal breath sounds.  Abdominal:     General: Abdomen is flat. Bowel sounds are normal.     Palpations: Abdomen is soft.  Musculoskeletal:        General: Normal range of motion.     Cervical back: Normal range of motion and neck supple.  Skin:    General: Skin is warm and dry.     Findings: No lesion or rash.  Neurological:     General: No focal deficit present.     Mental Status: Jordan Bell is alert and oriented to person, place, and time.  Psychiatric:  Mood and Affect: Mood normal.        Behavior: Behavior normal.        Thought Content: Thought content normal.        Judgment: Judgment normal.             Gwyneth Fernandez M Lavina Resor, PA-C

## 2024-07-07 NOTE — Patient Instructions (Signed)
 Great to see you!

## 2024-07-08 ENCOUNTER — Ambulatory Visit: Payer: Self-pay | Admitting: Physician Assistant

## 2024-07-12 NOTE — Telephone Encounter (Signed)
See pt response and advise

## 2024-07-18 ENCOUNTER — Other Ambulatory Visit: Payer: Self-pay

## 2024-07-18 DIAGNOSIS — E782 Mixed hyperlipidemia: Secondary | ICD-10-CM

## 2024-07-18 DIAGNOSIS — Z131 Encounter for screening for diabetes mellitus: Secondary | ICD-10-CM

## 2024-09-08 ENCOUNTER — Other Ambulatory Visit: Payer: Self-pay | Admitting: Medical Genetics

## 2024-09-15 ENCOUNTER — Ambulatory Visit (INDEPENDENT_AMBULATORY_CARE_PROVIDER_SITE_OTHER)

## 2024-09-15 DIAGNOSIS — Z23 Encounter for immunization: Secondary | ICD-10-CM

## 2024-09-15 NOTE — Progress Notes (Signed)
 Patient is in office today for a nurse visit for Immunization. Patient Injection was given in the  Left deltoid. Patient tolerated injection well. Patient stated no other questions or concerns.

## 2024-09-15 NOTE — Addendum Note (Signed)
 Addended by: CRISTOPHER TAWNI BROCKS on: 09/15/2024 09:16 AM   Modules accepted: Orders

## 2024-09-21 ENCOUNTER — Other Ambulatory Visit

## 2024-09-21 DIAGNOSIS — Z006 Encounter for examination for normal comparison and control in clinical research program: Secondary | ICD-10-CM

## 2024-10-11 LAB — GENECONNECT MOLECULAR SCREEN: Genetic Analysis Overall Interpretation: NEGATIVE

## 2025-07-11 ENCOUNTER — Encounter: Admitting: Physician Assistant
